# Patient Record
Sex: Female | Born: 1955 | Hispanic: Yes | Smoking: Never smoker
Health system: Western US, Academic
[De-identification: ages and names within clinical notes are randomized; demographics above are authoritative.]

## PROBLEM LIST (undated history)

## (undated) DIAGNOSIS — M1990 Unspecified osteoarthritis, unspecified site: Secondary | ICD-10-CM

## (undated) DIAGNOSIS — M459 Ankylosing spondylitis of unspecified sites in spine: Secondary | ICD-10-CM

## (undated) DIAGNOSIS — I10 Essential (primary) hypertension: Secondary | ICD-10-CM

## (undated) DIAGNOSIS — E669 Obesity, unspecified: Secondary | ICD-10-CM

## (undated) DIAGNOSIS — M545 Low back pain: Secondary | ICD-10-CM

## (undated) DIAGNOSIS — E785 Hyperlipidemia, unspecified: Secondary | ICD-10-CM

## (undated) DIAGNOSIS — E039 Hypothyroidism, unspecified: Secondary | ICD-10-CM

## (undated) DIAGNOSIS — E119 Type 2 diabetes mellitus without complications: Secondary | ICD-10-CM

## (undated) DIAGNOSIS — E1169 Type 2 diabetes mellitus with other specified complication: Secondary | ICD-10-CM

## (undated) DIAGNOSIS — K219 Gastro-esophageal reflux disease without esophagitis: Secondary | ICD-10-CM

## (undated) HISTORY — PX: KNEE SURGERY: SHX244

## (undated) HISTORY — PX: ABDOMINAL HYSTERECTOMY: SHX81

## (undated) HISTORY — DX: Obesity, unspecified: E66.9

## (undated) HISTORY — DX: Hyperlipidemia, unspecified: E78.5

## (undated) HISTORY — DX: Type 2 diabetes mellitus without complications (CMS-HCC): E11.9

## (undated) HISTORY — DX: Hyperlipidemia, unspecified: E11.69

## (undated) HISTORY — DX: Essential (primary) hypertension: I10

## (undated) HISTORY — DX: Gastro-esophageal reflux disease without esophagitis: K21.9

## (undated) HISTORY — DX: Hypothyroidism, unspecified: E03.9

## (undated) HISTORY — DX: Low back pain: M54.5

---

## 2005-01-28 HISTORY — PX: RIGHT OOPHORECTOMY: SHX2359

## 2014-05-11 ENCOUNTER — Encounter: Payer: Self-pay | Admitting: Family Medicine

## 2014-05-11 ENCOUNTER — Ambulatory Visit: Payer: Self-pay | Admitting: Family Practice

## 2014-05-11 VITALS — BP 162/80 | HR 88 | Temp 97.0°F | Resp 16 | Wt 191.5 lb

## 2014-05-11 DIAGNOSIS — Z0000 Encounter for general adult medical examination without abnormal findings: Secondary | ICD-10-CM

## 2014-05-11 DIAGNOSIS — E1169 Type 2 diabetes mellitus with other specified complication: Secondary | ICD-10-CM

## 2014-05-11 DIAGNOSIS — F419 Anxiety disorder, unspecified: Secondary | ICD-10-CM

## 2014-05-11 DIAGNOSIS — E039 Hypothyroidism, unspecified: Secondary | ICD-10-CM

## 2014-05-11 DIAGNOSIS — I10 Essential (primary) hypertension: Secondary | ICD-10-CM

## 2014-05-11 DIAGNOSIS — E119 Type 2 diabetes mellitus without complications: Principal | ICD-10-CM

## 2014-05-11 DIAGNOSIS — E785 Hyperlipidemia, unspecified: Secondary | ICD-10-CM

## 2014-05-11 MED ORDER — ATORVASTATIN CALCIUM 40 MG OR TABS
40.0000 mg | ORAL_TABLET | Freq: Every day | ORAL | Status: DC
Start: 2014-05-11 — End: 2014-08-03

## 2014-05-11 MED ORDER — LEVOTHYROXINE SODIUM 112 MCG OR TABS
112.0000 ug | ORAL_TABLET | Freq: Every day | ORAL | Status: DC
Start: 2014-05-11 — End: 2014-06-08

## 2014-05-11 MED ORDER — METFORMIN HCL 500 MG OR TABS
500.0000 mg | ORAL_TABLET | Freq: Two times a day (BID) | ORAL | Status: DC
Start: 2014-05-11 — End: 2014-08-03

## 2014-05-11 MED ORDER — METOPROLOL SUCCINATE 100 MG OR TB24
100.0000 mg | ORAL_TABLET | Freq: Every day | ORAL | Status: DC
Start: 2014-05-11 — End: 2014-08-03

## 2014-05-11 MED ORDER — ASPIRIN EC 81 MG OR TBEC (CUSTOM)
81.0000 mg | DELAYED_RELEASE_TABLET | Freq: Every day | ORAL | Status: DC
Start: 2014-05-11 — End: 2014-08-03

## 2014-05-11 MED ORDER — LISINOPRIL 40 MG OR TABS
40.0000 mg | ORAL_TABLET | Freq: Every day | ORAL | Status: DC
Start: 2014-05-11 — End: 2014-08-03

## 2014-05-11 MED ORDER — HYDROCHLOROTHIAZIDE 25 MG OR TABS
25.0000 mg | ORAL_TABLET | Freq: Every day | ORAL | Status: DC
Start: 2014-05-11 — End: 2014-08-03

## 2014-05-11 NOTE — Progress Notes (Signed)
[place] [facility] Progress Note    Student(s): Tressie Stalker    Attending: Dr. Katrinka Blazing      SUBJECTIVE:  Felicia [name] is a 59 year old female with history of type 2 diabetes mellitus, hyperlipidemia, and hypothyroidism presenting to establish care.     She received her care previously at [facility] but could no longer afford to pay for visits there.    Anxiety: Pt feels she excessively worries. She states she does not feel depressed but is tearful during the interview. She feels nervous today coming to a new health care facility. Has had insomnia for the last month, mainly trouble falling asleep. Regarding her sleep hygiene, she does not drink caffeine but does watch TV before bed and has a lot of thoughts that keep her up (such as worrying about her sons). She denies recent stressors but lives with her husband who she caught cheating 10 years ago and has almost no communication with and poor support from. Has not had formal therapy but is interested in it.     Diabetes: Pt does not check sugars since she is well-controlled (last A1c at [facility] was 6.2 on 03/02/14) and does not own a glucometer. Denies polyuria, polydipsia, vision changes, and numbness/tingling. She states she was seen by a nutrition clinic in the past and has had success with a low-carb diet, having lost 5 pounds.     Hypertension: Pt does not check her blood pressures at home. She typically has had okay readings at prior visits at [facility] (SBP at 03/29/14 visit was 120). She notes she feels more nervous today since this is a new health care facility for her. Currently on lisinopril 40 mg daily, HCTZ 25 mg daily, and metoprolol 100 mg daily, which she takes regularly but ran out of the metoprolol 2 days ago. Endorses a pressure-like headache 2 days ago but resolved on its own. Denies vision changes.    Hyperlipidemia: Currently on lovastatin 20 mg daily and omega3 1000 mg BID. Last lipid panel at [facility] - cholesterol 149,  HDL 44, LDL 81, triglycerides 120 (date unclear).    Hypothyroidism: Pt notes that she does not have symptoms with her hypothyroidism. Denies fatigue, constipation, cold intolerance, brittle hair.     Health care maintenance: Negative pap and mammo in Feb 2016. Did not get a flu vaccine this year. Has never had colon cancer screening.      Social History: Lives with husband and 2 adult children. Has another child who lives in [place]. Never smoker, never drinker, no drug use. Not currently sexually active.    Current Outpatient Prescriptions   Medication Sig    aspirin 81 MG EC tablet Take 1 tablet (81 mg) by mouth daily.    atorvastatin (LIPITOR) 40 MG tablet Take 1 tablet (40 mg) by mouth daily.    hydrochlorothiazide (HYDRODIURIL) 25 MG tablet Take 1 tablet (25 mg) by mouth daily.    levothyroxine (SYNTHROID) 112 MCG tablet Take 1 tablet (112 mcg) by mouth every morning (before breakfast).    lisinopril (PRINIVIL, ZESTRIL) 40 MG tablet Take 1 tablet (40 mg) by mouth daily.    metFORMIN (GLUCOPHAGE) 500 MG tablet Take 1 tablet (500 mg) by mouth 2 times daily (with meals).    metoprolol succinate (TOPROL XL) 100 MG XL tablet Take 1 tablet (100 mg) by mouth daily.     No current facility-administered medications for this visit.       Review  of Systems: See above. Denies chest pain, shortness of breath, bowel/bladder changes.      OBJECTIVE:  BP 162/80 mmHg   Pulse 88   Temp(Src) 97 F (36.1 C)   Resp 16   Wt 86.864 kg (191 lb 8 oz)  Gen: middle-aged, overweight Hispanic female, tearful at times during interview  Lungs: clear to auscultation bilaterally  CV: regular rate and rhythm  Ext: DP, PT pulses +2 and sensation to light touch intact bilaterally    Labs: 03/02/14 Hgb A1c 6.2  ?date lipid panel - cholesterol 149, HDL 44, LDL 81, triglycerides 120    PHQ 2: 2      Assessment and Plan:  Felicia [name] was seen today to establish care.    Diagnoses and all orders for this visit:    Type 2 diabetes mellitus without  complication (HCC)  -     metFORMIN (GLUCOPHAGE) 500 MG tablet; Take 1 tablet (500 mg) by mouth 2 times daily (with meals).  -     aspirin 81 MG EC tablet; Take 1 tablet (81 mg) by mouth daily.  -     Labs: Hgb A1c, microalbumin:Cr, BMP (check Cr since pt on metformin)    Hyperlipidemia associated with type 2 diabetes mellitus - lovastatin not available, will switch to atorvastatin (high intensity since pt diabetic and ASCVD risk >10%)  -     atorvastatin (LIPITOR) 40 MG tablet; Take 1 tablet (40 mg) by mouth daily.    Hypothyroidism, unspecified type  -     levothyroxine (SYNTHROID) 75 MCG tablet; Take 1.5 tablets (112 mcg) by mouth every morning (before breakfast).  -     Labs: TSH    Anxiety  - Therapy: Pt changed her mind and does not want to talk to Psychology today. Psychology will check-in with pt at next follow-up    Essential hypertension - Hypertensive today with SBP in 160's, though by history controlled. May be attributed to pt's increased anxiety today and being off metoprolol for a couple days. We will keep pt on previous meds and recheck BP in a month.  -     lisinopril (PRINIVIL, ZESTRIL) 40 MG tablet; Take 1 tablet (40 mg) by mouth daily.  -     hydrochlorothiazide (HYDRODIURIL) 25 MG tablet; Take 1 tablet (25 mg) by mouth daily.  -     metoprolol succinate (TOPROL XL) 100 MG XL tablet; Take 1 tablet (100 mg) by mouth daily.    Health care maintenance  - Colon cancer screening with FOBT    Patient to RTC on: 06/08/14    To do items at next visit:  BP check, lab results (Cr [pt on metformin], TSH, A1c, microalbumin:Cr), levothyroxine refill, FOBT cards, Psychology to check-in with pt    Patient seen, discussed and examined with Dr. Arvil ChacoSmith    Felicia [name], MS4

## 2014-05-13 NOTE — Progress Notes (Signed)
I saw and discussed the patient w/ Tressie StalkerSusan Onami MS4 and agree w/ documentation.  Dr. Rebeca AlertSunny Shimika Ames  [place] license [id number]

## 2014-05-18 ENCOUNTER — Encounter: Payer: Self-pay | Admitting: Family Medicine

## 2014-05-18 LAB — BASIC METABOLIC PANEL, BLOOD
BUN: 15
Bicarbonate: 20
Calcium: 9.5
Chloride: 103
Creatinine: 0.52
Glucose: 91
Potassium: 3.8
Sodium: 138
eGFR non-Afr.American: 104

## 2014-05-18 LAB — RANDOM URINE MICROALBUMIN
Creatinine, Random Urine: 51
Microalbumin, Urine: 0.2

## 2014-05-18 LAB — GLYCOSYLATED HGB(A1C), BLOOD: Glyco Hgb (A1C): 6.3 — ABNORMAL HIGH

## 2014-05-18 NOTE — Progress Notes (Signed)
Labs reviewed and entered in EPIC  Significant for well controlled DM with A1C = 6.3 and normal Micro/Cr    Pt to RTC as directed for f/u    N. Sherlon Handingodriguez, M.D.  [id number]

## 2014-05-18 NOTE — Progress Notes (Signed)
Lab results from 05/11/14. Entered by Theda SersVictoria Chang, MS1.    Results remarkable for elevated HbA1C in the diabetic range. Patient currently taking Metformin. May consider adding medication to lower HbA1C. Glucose at the time of blood draw within normal range.

## 2014-06-08 ENCOUNTER — Ambulatory Visit: Payer: Self-pay | Admitting: Family Practice

## 2014-06-08 VITALS — BP 138/86

## 2014-06-08 DIAGNOSIS — E119 Type 2 diabetes mellitus without complications: Secondary | ICD-10-CM

## 2014-06-08 DIAGNOSIS — E039 Hypothyroidism, unspecified: Principal | ICD-10-CM

## 2014-06-08 DIAGNOSIS — I10 Essential (primary) hypertension: Secondary | ICD-10-CM

## 2014-06-08 MED ORDER — LEVOTHYROXINE SODIUM 112 MCG OR TABS
112.0000 ug | ORAL_TABLET | Freq: Every day | ORAL | Status: DC
Start: 2014-06-08 — End: 2014-08-03

## 2014-06-08 NOTE — Progress Notes (Signed)
[place] [facility] Progress Note    Student(s): Harvest DarkAriana Dagdag, [place]    Attending: Dr. Rebeca AlertSunny [name]    SUBJECTIVE:  Felicia [name] is a 59 year old female with history of type 2 diabetes mellitus, hyperlipidemia, anxiety and hypothyroidism presenting for f/u to review lab results and new ear complaint    #Ear complaint  -Feels she has sensation of "something moving" in her ear. States that in her culture this is a sign of "air in the brain"  -Denies any ear pain. Was nervous about putting cotton swab in her ear but has been using her finger a lot to rub the inside of her ear.    #DM2. A1c 6.3 (05/11/14)  -Pt doesn't wish to check FBGs at home because she is "in denial" about her DM2 Dx  -Denies polyuria, polydipsia, polyphagia, blurry vision, foot numbness    #Anxiety  -Anxiety symptoms improved since last visit. Takes walks to help when her stress and anxiety get bad.  -Not interested in seeing psychologist at this time    #Hypertension:   -Currently on lisinopril 40 mg daily, HCTZ 25 mg daily, and metoprolol 100 mg daily  -Denies HA, dizziness, CP    #Hyperlipidemia  -Taking Atorvastatin 40mg     #Hypothyroidism:  -Taking Levothyroxine 112 mcg daily  -Endorses dry skin but denies fatigue, constipation, cold intolerance, brittle hair.     Current Outpatient Prescriptions   Medication Sig    aspirin 81 MG EC tablet Take 1 tablet (81 mg) by mouth daily.    atorvastatin (LIPITOR) 40 MG tablet Take 1 tablet (40 mg) by mouth daily.    hydrochlorothiazide (HYDRODIURIL) 25 MG tablet Take 1 tablet (25 mg) by mouth daily.    levothyroxine (SYNTHROID) 112 MCG tablet Take 1 tablet (112 mcg) by mouth every morning (before breakfast).    lisinopril (PRINIVIL, ZESTRIL) 40 MG tablet Take 1 tablet (40 mg) by mouth daily.    metFORMIN (GLUCOPHAGE) 500 MG tablet Take 1 tablet (500 mg) by mouth 2 times daily (with meals).    metoprolol succinate (TOPROL XL) 100 MG XL tablet Take 1 tablet (100 mg) by mouth daily.          No current facility-administered medications for this visit.     Review of Systems: negative besides what is mentioned in HPI    OBJECTIVE:  BP 138/86 mmHg  Gen: Pleasant, overweight Hispanic female, no acute distress  HEENT: Very slight erythema of the external auditory canal R>L with evidence of small abrasion/irritation in the superficial anterori part of the R ear canal  Lungs: clear to auscultation bilaterally  CV: regular rate and rhythm  Ext: DP, PT pulses +1 and sensation to light touch intact bilaterally  Neuro: normal monofilament exam    Labs:     Lab Results   Component Value Date    A1C 6.3 05/11/2014     Lab Results   Component Value Date    CREAT 0.52 05/11/2014     Microalbumin/Cr ratio: 4    ?date lipid panel - cholesterol 149, HDL 44, LDL 81, triglycerides 120    PHQ 2: 2    Assessment and Plan:    Felicia HornMaria Moreno Laske is a 59 year old female with history of type 2 diabetes mellitus, hyperlipidemia, anxiety and hypothyroidism presenting for f/u to review lab results and new ear complaint    Diagnoses and all orders for this visit:    Ear problem. Small area of redness in the  right ear, appears to be irritation or scratch, no other obvious abnormalities seen  -Instructed pt to avoid putting her finger in her ear as there is evidence of abrasion or scratch from nail  -Advised pt to let warm water run into ear in the shower or to use bulb syringe from drug store for irrigation but to avoid putting anything in the ear    Type 2 diabetes mellitus without complication. Very well controlled with HbA1c 6.3. Pt not checking sugars at home, OK given good control with HbA1c. No evidence of kidney dysfunction with normal Cr and microalbumin/Cr ratio.  -     metFORMIN (GLUCOPHAGE) 500 MG tablet; Take 1 tablet (500 mg) by mouth 2 times daily (with meals).  -     aspirin 81 MG EC tablet; Take 1 tablet (81 mg) by mouth daily.    Hyperlipidemia associated with type 2 diabetes mellitus -  -     atorvastatin  (LIPITOR) 40 MG tablet; Take 1 tablet (40 mg) by mouth daily.    Hypothyroidism, unspecified type. Adequate dosing levothyroxine given TSH 0.63 (05/11/14)  -     levothyroxine (SYNTHROID) 75 MCG tablet; Take 1.5 tablets (112 mcg) by mouth every morning (before breakfast), filled today    Anxiety  - Continue stress relief strategies (walking etc)  - Therapy could be an option in the future if patient changes her mind    Essential hypertension - BP well controlled on initial check at 138/86 but increased to 156/86 on recheck. Pt reports BP in normal range (can't remember number) when checking at drug store. Given BP at goal on first check today and reported normal range BPs at home will continue current medication regimen.   - Could consider increased dosing in the future despite fact that elevated BP appears to be 2/2 white coat hypertention given concern that BP is most likely frequently elevated in this patient with ongoing anxiety issues.  -     lisinopril (PRINIVIL, ZESTRIL) 40 MG tablet; Take 1 tablet (40 mg) by mouth daily.  -     hydrochlorothiazide (HYDRODIURIL) 25 MG tablet; Take 1 tablet (25 mg) by mouth daily.  -     metoprolol succinate (TOPROL XL) 100 MG XL tablet; Take 1 tablet (100 mg) by mouth daily.    Health care maintenance  -Negative pap and mammo in Feb 2016  -FOBT given to lab today    Patient to RTC on: August 03, 2014 for medication refills    To do at next visit:  -Refill all medications  -Reckeck BP, consider medication adjustments    Patient seen, discussed and examined with Dr. Lonzo CandySmith    Ariana Dagdag, MS4

## 2014-06-09 LAB — CREATININE W/GFR: Microalbumin Ratio: 4

## 2014-06-09 LAB — TSH, BLOOD: TSH: 0.63

## 2014-06-09 NOTE — Progress Notes (Signed)
I saw and discussed the patient w/ Ariana Dagdag MS4 and agree w/ documentation.  Dr. Madeliene Tejera  [place] license [id number]

## 2014-06-15 ENCOUNTER — Encounter: Payer: Self-pay | Admitting: Family Medicine

## 2014-06-15 NOTE — Progress Notes (Signed)
Patient received FOBT cards 06/08/14 and results from the FOBT were performed 06/15/14. No blood was detected in any of the three boxes (0/3 boxes positive for blood).     Please have patient complete FOBT in one more year.    Results Input by: Claiborne RiggStephanie Walker, MS1  Reviewed by: Dr. Rebeca AlertSunny Smith

## 2014-08-03 ENCOUNTER — Ambulatory Visit: Payer: Self-pay | Admitting: Family Practice

## 2014-08-03 ENCOUNTER — Encounter: Payer: Self-pay | Admitting: Family Practice

## 2014-08-03 VITALS — BP 142/70 | HR 72 | Temp 98.9°F | Resp 18 | Wt 193.0 lb

## 2014-08-03 DIAGNOSIS — E785 Hyperlipidemia, unspecified: Secondary | ICD-10-CM

## 2014-08-03 DIAGNOSIS — K2970 Gastritis, unspecified, without bleeding: Principal | ICD-10-CM

## 2014-08-03 DIAGNOSIS — E1169 Type 2 diabetes mellitus with other specified complication: Secondary | ICD-10-CM

## 2014-08-03 DIAGNOSIS — I10 Essential (primary) hypertension: Secondary | ICD-10-CM

## 2014-08-03 DIAGNOSIS — E119 Type 2 diabetes mellitus without complications: Secondary | ICD-10-CM

## 2014-08-03 DIAGNOSIS — E039 Hypothyroidism, unspecified: Secondary | ICD-10-CM

## 2014-08-03 MED ORDER — METFORMIN HCL 500 MG OR TABS
500.0000 mg | ORAL_TABLET | Freq: Two times a day (BID) | ORAL | 0 refills | Status: DC
Start: 2014-08-03 — End: 2014-11-02

## 2014-08-03 MED ORDER — LEVOTHYROXINE SODIUM 112 MCG OR TABS
112.0000 ug | ORAL_TABLET | Freq: Every day | ORAL | 0 refills | Status: DC
Start: 2014-08-03 — End: 2014-11-02

## 2014-08-03 MED ORDER — HYDROCHLOROTHIAZIDE 25 MG OR TABS
25.0000 mg | ORAL_TABLET | Freq: Every day | ORAL | 0 refills | Status: DC
Start: 2014-08-03 — End: 2014-11-02

## 2014-08-03 MED ORDER — ASPIRIN EC 81 MG OR TBEC (CUSTOM)
81.0000 mg | DELAYED_RELEASE_TABLET | Freq: Every day | ORAL | 0 refills | Status: DC
Start: 2014-08-03 — End: 2014-11-02

## 2014-08-03 MED ORDER — LISINOPRIL 40 MG OR TABS
40.0000 mg | ORAL_TABLET | Freq: Every day | ORAL | 0 refills | Status: DC
Start: 2014-08-03 — End: 2014-11-02

## 2014-08-03 MED ORDER — ATORVASTATIN CALCIUM 40 MG OR TABS
40.0000 mg | ORAL_TABLET | Freq: Every day | ORAL | 0 refills | Status: DC
Start: 2014-08-03 — End: 2014-11-02

## 2014-08-03 MED ORDER — RANITIDINE HCL 150 MG OR TABS
150.0000 mg | ORAL_TABLET | Freq: Two times a day (BID) | ORAL | 0 refills | Status: DC
Start: 2014-08-03 — End: 2014-11-02

## 2014-08-03 MED ORDER — METOPROLOL SUCCINATE 100 MG OR TB24
100.0000 mg | ORAL_TABLET | Freq: Every day | ORAL | 0 refills | Status: DC
Start: 2014-08-03 — End: 2014-11-02

## 2014-08-03 NOTE — Progress Notes (Signed)
[place] [facility] Progress Note  Students: Lauraine Rinne, MS4 and Wallis Bamberg, PS1  Attending: Dr. Rebeca Alert    SUBJECTIVE:  Felicia [name] is a 59 year old female with history of type 2 diabetes mellitus, hyperlipidemia, anxiety and hypothyroidism presenting for f/u on HTN and med refills.    # Bloating and epigastric burning  For the past 3 days she has bloating after meals, no changes in diet.  Does have some burning sensation in epigastric region, worse with spicy foods.  No belching or sour taste in mouth.  Eating smaller, more frequent meals to avoid this sensation. Had been diagnosed with "gastritis" 4 years ago, refused medication so she just changed her diet to avoid spicy foods.  Doesn't recall ever having stool testing for H. pylori.   Has omeprazole from a prescription about a year ago, worked well for a period of time and did not use antibiotics, but doesn't use now.  Feels like she has the same symptoms now.    Thinks this problem might be from her medicines, although the last one was changed three months ago.  Not taking any NSAIDs, no alcohol use, no caffeine.  No nausea or vomiting.  Had an episode of GI upset with nausea, vomiting, and diarrhea a month ago (bug going around at housing complex) but currently not experiencing other GI symptoms.  No diarrhea or constipation.  No abdominal pain.     # Right knee pain  Other complaint is of right knee pain for 4 days, thinks its related to the weather.  Never had knee pain before, no problems with right knee.  Feels like there's a nail in her knee.  Exacerbated by standing after sitting for a period of time.  Walking helps the pain.  No other joint pains.  No fevers or chills.  No muscle aches or pain with walking.    Complains of pain at dorsum of feet and inner aspect of ankles when she steps on cold ground.  No pain at plantar surfaces, no numbness or tingling.    Denies new life stressors or other changes.    Exercise: 1 hour of  walking, 5-6 days/week.    # DM2, A1c 6.3 (05/11/14)  - Pt still doesn't check FBGs at home because she says a doctor from [facility] told her a year ago that she doesn't need to check.  - Drinks liquado with half green apple and 4 strawberries.  Avoids starches, juices, sodas.  - Denies polyuria, polydipsia, polyphagia, blurry vision, foot numbness    # Hypertension   -Currently on lisinopril 40 mg daily, HCTZ 25 mg daily, and metoprolol 100 mg daily.  Takes all three in the morning, reports full compliance.  Doesn't check BPs on her own.  -Denies HA, dizziness, CP, DOE    #Hyperlipidemia  -Taking Atorvastatin 40mg     #Hypothyroidism:  -Taking Levothyroxine 112 mcg daily  -Endorses dry skin but denies fatigue, constipation, cold intolerance, brittle hair.     # Anxiety  - Anxiety symptoms stable since last visit. Takes walks to help when her stress and anxiety get bad.  -Not interested in seeing psychologist at this time    # Ear itching  Still has itching and foreign body sensation in her right ear.  Has not been compliant with suggestion to avoid using finger to pick at ear or trial lavage.  Denies ear pain.  Not particularly bothered by symptoms, says she doesn't need further evaluation or  treatment.    Current Outpatient Prescriptions   Medication Sig    aspirin 81 MG EC tablet Take 1 tablet (81 mg) by mouth daily.    atorvastatin (LIPITOR) 40 MG tablet Take 1 tablet (40 mg) by mouth daily.    hydrochlorothiazide (HYDRODIURIL) 25 MG tablet Take 1 tablet (25 mg) by mouth daily.    levothyroxine (SYNTHROID) 112 MCG tablet Take 1 tablet (112 mcg) by mouth every morning (before breakfast).    lisinopril (PRINIVIL, ZESTRIL) 40 MG tablet Take 1 tablet (40 mg) by mouth daily.    metFORMIN (GLUCOPHAGE) 500 MG tablet Take 1 tablet (500 mg) by mouth 2 times daily (with meals).    metoprolol succinate (TOPROL XL) 100 MG XL tablet Take 1 tablet (100 mg) by mouth daily.     No current facility-administered medications  for this visit.    Has omeprazole from a prescription about a year ago, but doesn't use now.      Review of Systems: negative except as noted above in HPI    OBJECTIVE  BP 142/70  Pulse 72  Temp 98.9 F (37.2 C) (Oral)  Resp 18  Wt 87.5 kg (193 lb)     Gen: Pleasant, overweight Hispanic female, no acute distress  HEENT: Oropharynx clear, several dental fillings  Lungs: clear to auscultation bilaterally  CV: regular rate and rhythm  Abd: soft, non-distended, NABS in all quadrants, mild TTP in epigastrium, no rebound or guarding.  Ext: DP, PT pulses +1 and sensation to light touch intact bilaterally  Neuro: normal monofilament exam (performed by pharmacy student)    Labs:   Lab Results   Component Value Date    A1C 6.3 05/11/2014     Lab Results   Component Value Date    CREAT 0.52 05/11/2014     Microalbumin/Cr ratio: 4    ?date lipid panel - cholesterol 149, HDL 44, LDL 81, triglycerides 120    PHQ 2: 0    Assessment and Plan  Felicia [name] is a 59 year old female with DM 2, HTN, HLD, hypothyroidism, and anxiety presenting for f/u on BPs and med refill.      Bloating with epigastric tenderness and previous alleviation with omeprazole suggests GERD.  Offered H. pylori stool test but patient agrees to empiric trial of alternative antacid agent.  - Begin ranitidine  BID    DM 2: Very well controlled with HbA1c 6.3. Pt not checking sugars at home, OK given good control with HbA1c. No evidence of kidney dysfunction with normal Cr and microalbumin/Cr ratio.  Demonstrates good understanding of dietary control.   ASCVD 10-year risk 9.5%, 2.3% with optimized risk factors.  On appropriate atorva 40 qday  Recommend scheduling with ophtho clinic next visit for dilated fundus exam for retinopathy  -     Metformin  BID  -     Aspirin 81 mg daily    Essential hypertension - BP readings have been high in past but seem to be related to white coat hypertension.  Given borderline reading today of 142/70, would pursue  more reliable readings from in between visits before increasing medications.  Encouraged her to continue exercise and diet measures.  - Assigned BP checks at drugstores as she is able and bring 2-3 measurements with her at next clinic visit.  -     lisinopril (PRINIVIL, ZESTRIL) 40 MG tablet; Take 1 tablet (40 mg) by mouth daily.  -     hydrochlorothiazide (HYDRODIURIL) 25 MG tablet;  Take 1 tablet (25 mg) by mouth daily.  -     metoprolol succinate (TOPROL XL) 100 MG XL tablet; Take 1 tablet (100 mg) by mouth daily.    Hyperlipidemia associated with type 2 diabetes mellitus  -     atorvastatin (LIPITOR) 40 MG tablet; Take 1 tablet (40 mg) by mouth daily.    Hypothyroidism, unspecified type. Adequate dosing levothyroxine given TSH 0.63 (05/11/14) and no changes in symptoms.  -     levothyroxine (SYNTHROID) 75 MCG tablet; Take 1.5 tablets (112 mcg) by mouth every morning (before breakfast)    Ear itching: She is not particularly bothered by this and is not requesting further evaluation or treatment.    Anxiety  - Continue stress relief strategies (walking etc)  - Therapy could be an option in the future if patient changes her mind    Health care maintenance  - Pneumovax given today  - Negative pap and mammo in Feb 2016  - FOBT negative 05/2014    Patient to RTC in 3 months (11/02/2014) for medication refills and follow-up.    To do at next visit:  - Refill medications  - Reckeck BP and correlate with readings from outside, consider medication adjustments  - F/u on GERD symptoms with addition of ranitidine  - Discuss diabetic eye exam as she has never had one, consider scheduling for initial screening in ophtho clinic    Patient seen, discussed, and examined with Dr. Katrinka BlazingSmith.  Lauraine RinneJeff Tsao, MS4

## 2014-08-08 NOTE — Progress Notes (Signed)
I saw and discussed the patient w/the medical student and agree w/ documentation.  Dr. Rebeca AlertSunny [place]  [place] license [id number]

## 2014-11-02 ENCOUNTER — Ambulatory Visit: Payer: Self-pay | Admitting: Hospitalist

## 2014-11-02 ENCOUNTER — Encounter: Payer: Self-pay | Admitting: Family Practice

## 2014-11-02 DIAGNOSIS — K2970 Gastritis, unspecified, without bleeding: Secondary | ICD-10-CM

## 2014-11-02 DIAGNOSIS — E119 Type 2 diabetes mellitus without complications: Secondary | ICD-10-CM

## 2014-11-02 DIAGNOSIS — E039 Hypothyroidism, unspecified: Secondary | ICD-10-CM

## 2014-11-02 DIAGNOSIS — E785 Hyperlipidemia, unspecified: Principal | ICD-10-CM

## 2014-11-02 DIAGNOSIS — E1169 Type 2 diabetes mellitus with other specified complication: Principal | ICD-10-CM

## 2014-11-02 DIAGNOSIS — I10 Essential (primary) hypertension: Secondary | ICD-10-CM

## 2014-11-02 MED ORDER — RANITIDINE HCL 150 MG OR TABS
150.0000 mg | ORAL_TABLET | Freq: Two times a day (BID) | ORAL | 0 refills | Status: DC
Start: 2014-11-02 — End: 2015-04-26

## 2014-11-02 MED ORDER — HYDROCHLOROTHIAZIDE 25 MG OR TABS
25.0000 mg | ORAL_TABLET | Freq: Every day | ORAL | 0 refills | Status: DC
Start: 2014-11-02 — End: 2015-02-01

## 2014-11-02 MED ORDER — ASPIRIN EC 81 MG OR TBEC (CUSTOM)
81.0000 mg | DELAYED_RELEASE_TABLET | Freq: Every day | ORAL | 0 refills | Status: DC
Start: 2014-11-02 — End: 2015-02-01

## 2014-11-02 MED ORDER — LEVOTHYROXINE SODIUM 112 MCG OR TABS
112.0000 ug | ORAL_TABLET | Freq: Every day | ORAL | 0 refills | Status: DC
Start: 2014-11-02 — End: 2015-02-01

## 2014-11-02 MED ORDER — LISINOPRIL 40 MG OR TABS
40.0000 mg | ORAL_TABLET | Freq: Every day | ORAL | 0 refills | Status: DC
Start: 2014-11-02 — End: 2015-02-01

## 2014-11-02 MED ORDER — ATORVASTATIN CALCIUM 40 MG OR TABS
40.0000 mg | ORAL_TABLET | Freq: Every day | ORAL | 0 refills | Status: DC
Start: 2014-11-02 — End: 2015-02-01

## 2014-11-02 MED ORDER — METOPROLOL SUCCINATE 100 MG OR TB24
100.0000 mg | ORAL_TABLET | Freq: Every day | ORAL | 0 refills | Status: DC
Start: 2014-11-02 — End: 2015-02-01

## 2014-11-02 MED ORDER — METFORMIN HCL 500 MG OR TABS
500.0000 mg | ORAL_TABLET | Freq: Two times a day (BID) | ORAL | 0 refills | Status: DC
Start: 2014-11-02 — End: 2015-02-01

## 2014-11-02 NOTE — Progress Notes (Signed)
[facility] Progress Note    Student(s): Gregary Signs MS4, Garth Schlatter MS1    Attending: Dr. Roseanne Reno    SUBJECTIVE:  Felicia [name] is a 59 year old female with a history of GERD, DM2, HTN, HLD, Hypothyroidism, and anxiety who presents today with back pain and for medicine refills.     #Back Pain:  Patient presents with back pain that began on the bus today. The pain was described as pressure and spasm-like, located mid-way down the back medially as well as laterally approximately 2 inches out. Pain lasted for a few seconds before being resolved by walking, which she reports made it feel warm. Pain was a 10/10 when it occurred. Non-radiating. Never had similar pain before this episode.     #DM2:  Ms. Hanger denies symptoms consistent with diabetes. Does not check her blood sugar; was told by someone at [facility] that she does not need to. Currently taking Metformin 500mg  BID, which she generally takes regularly though occasionally only takes once/day. Also taking aspirin 81mg . She denies polydipsia, polyuria, polyphagia. Denies numbness in the feet/hands and blurred vision.  Did not see opthomology yet, referral placed at last visit.     #Hypothyroidism:  Patient with history of hypothyroidism controlled on levothyroxine qDay. Last TSH 0.63 (05/11/2014). Today, patient reports dry skin on hands, for which she uses lotion which brings her resolve. Occasionally itchy, though not regularly. Otherwise, denies symptoms of fatigue, dry hair/nails, cold intolerance, and constipation. In fact, patient states she regularly feels hot rather than cold.    #HTN:   Patient with history of HTN controlled with lisinopriol 40mg  qDay, HCTZ 25mg  qday, metoprolol 100mg  qday. Reports that BP increases when she sees healthcare providers ("white coat" HTN). However, does not have a baseline outside of hospital due to lack of BP cuff in house, and does not visit [facility]. She denies dizziness, HAs, SOB,  CP, and DOE.     #GERD:  Provided ranitidine 150mg  at last visit due to feelings of epigastric tenderness and reflux symptoms. Has been taking BID with resolve of symptoms. Denies abdominal pain, vomiting, diarrhea, and constipation.     #HLD:  Patient currently taking atorvastatin 40mg , no reported symptoms.     #Anxiety:  Patient denies feeling anxious today. Reports walking with friends to control anxiety. Has not seen a psychologist in the past.     Current Outpatient Prescriptions   Medication Sig    aspirin 81 MG EC tablet Take 1 tablet (81 mg) by mouth daily.    atorvastatin (LIPITOR) 40 MG tablet Take 1 tablet (40 mg) by mouth daily.    hydrochlorothiazide (HYDRODIURIL) 25 MG tablet Take 1 tablet (25 mg) by mouth daily.    levothyroxine (SYNTHROID) 112 MCG tablet Take 1 tablet (112 mcg) by mouth every morning (before breakfast).    lisinopril (PRINIVIL, ZESTRIL) 40 MG tablet Take 1 tablet (40 mg) by mouth daily.    metFORMIN (GLUCOPHAGE) 500 MG tablet Take 1 tablet (500 mg) by mouth 2 times daily (with meals).    metoprolol succinate (TOPROL XL) 100 MG XL tablet Take 1 tablet (100 mg) by mouth daily.    ranitidine (ZANTAC) 150 MG tablet Take 1 tablet (150 mg) by mouth 2 times daily.     No current facility-administered medications for this visit.        Review of Systems: As in HPI    OBJECTIVE:   11/02/14  1900 11/02/14  2050   BP:  142/78   Pulse: 76    Resp: 18    Temp: 98 F (36.7 C)      Gen: WDWN, NAD adult female  Lungs: CTAB, no w/r/r, no increased work of breathing   CV: RRR, normal S1/S2, no m/r/g  Back: No abnormal curvature. Patient with TTP over the medial aspect of the back, around T8-T12, with point tenderness at the lateral aspects approximately 2-3 cm from the midline.     Labs:  Lab Results   Component Value Date    A1C 6.3 05/11/2014     Lab Results   Component Value Date    NA 138 05/11/2014    K 3.8 05/11/2014    CL 103 05/11/2014    BICARB 20 05/11/2014    BUN 15 05/11/2014     CREAT 0.52 05/11/2014    GLU 91 05/11/2014    [place] 9.5 05/11/2014     Lab Results   Component Value Date    TSH 0.63 05/11/2014     Lab Results   Component Value Date    CREAT 0.52 05/11/2014     Microalbumin/Cr ratio: 4    ?date lipid panel - cholesterol 149, HDL 44, LDL 81, triglycerides 120    Assessment and Plan:    #Back Pain: Isolated episode of sharp non-radiating paravertebral back pain, reproducible on exam. Based on symptomatology and exam findings, most likely diagnosis is  Of muscular origin, likely muscle spasm/tightness. Low concern for neuronal etiology due to isolated incident and non-radiating, non-numbness findings.  - Recommended stretching exercises, specifically of hamstrings 1-2 minutes/day  - Follow up at next visit for resolution     #DM2: Well controlled. Last HbA1c 6.3 (05/11/2014).   - Continue Metformin  BID and aspirin 81 mg qDay  - Encouraged continued walking and diet low in carbohydrates  - Follow up at next visit for opthomology appointment, consider re-referral if not done  - HbA1c ordered today    #Hypothyroidism: Controlled, with potential signs of over-correction in the setting of feeling excessively warm and with last TSH 0.63 (05/11/2014).   - TSH ordered today  - Plan to continue on 112 mcg levothyroxine, can consider decreasing dose if considered over-corrected    #HTN: BP readings have been elevated in the past, but have been attributed to white coat HTN. Borderline reading again today of 142/78. Unclear if truly due to white coat HTN because don't have baseline readings outside of clinic. Currently asymptomatic.   - Encouraged patient to get [facility] at [facility] or other local pharmacy before next visit  - Continue lisinopril  qDay, HCTZ  qDay, and metoprolol succinate  XL qDay    #GERD: Controlled on ranitidine  BID.   - Continue ranitidine  BID    #HLD: Controlled on atorvastatin  qDay  -Continue atorvastatin   - Plan for lipid panel  04/2015    #Anxiety: Patient reports anxiety is controlled, though has heightened concern of isolated incident of back pain experienced today which raises concern for continued anxiety. Patient does not believe she requires any intervention at this time.   - Follow up at next visit regarding anxiety symptoms    Patient to RTC in: 3 months on 02/01/2015    To do items at next visit:  - Review lab results, make adjustments to medications as necessary  - Check for optho appointment completion  - Ask about baseline BP measurements at outside facilities   - Medication refills    Patient  seen, discussed and examined with Dr. Roseanne Reno.    Gregary Signs, MS4  Garth Schlatter, MS1

## 2014-11-09 ENCOUNTER — Encounter: Payer: Self-pay | Admitting: Family Medicine

## 2014-11-09 LAB — GLYCOSYLATED HGB(A1C), BLOOD: Glyco Hgb (A1C): 7.3 — ABNORMAL HIGH

## 2014-11-09 LAB — TSH, BLOOD: TSH: 4

## 2014-11-09 NOTE — Progress Notes (Addendum)
Labs reviewed and entered in EPIC  Significant for A1C of 7.3    Pt to RTC as directed for f/u    N. Sherlon Handingodriguez, M.D.  [id number]

## 2014-11-09 NOTE — Progress Notes (Deleted)
Encounter created in error

## 2014-11-29 DIAGNOSIS — M545 Low back pain, unspecified: Secondary | ICD-10-CM

## 2014-11-29 HISTORY — DX: Low back pain, unspecified: M54.50

## 2015-02-01 ENCOUNTER — Ambulatory Visit: Payer: Self-pay | Admitting: Family Practice

## 2015-02-01 DIAGNOSIS — M25551 Pain in right hip: Secondary | ICD-10-CM

## 2015-02-01 DIAGNOSIS — I10 Essential (primary) hypertension: Secondary | ICD-10-CM

## 2015-02-01 DIAGNOSIS — M25552 Pain in left hip: Principal | ICD-10-CM

## 2015-02-01 DIAGNOSIS — E1169 Type 2 diabetes mellitus with other specified complication: Secondary | ICD-10-CM

## 2015-02-01 DIAGNOSIS — E785 Hyperlipidemia, unspecified: Secondary | ICD-10-CM

## 2015-02-01 DIAGNOSIS — E119 Type 2 diabetes mellitus without complications: Secondary | ICD-10-CM

## 2015-02-01 DIAGNOSIS — E039 Hypothyroidism, unspecified: Secondary | ICD-10-CM

## 2015-02-01 MED ORDER — METFORMIN HCL 500 MG OR TABS
500.0000 mg | ORAL_TABLET | Freq: Two times a day (BID) | ORAL | 0 refills | Status: DC
Start: 2015-02-01 — End: 2015-04-26

## 2015-02-01 MED ORDER — LEVOTHYROXINE SODIUM 112 MCG OR TABS
112.0000 ug | ORAL_TABLET | Freq: Every day | ORAL | 0 refills | Status: DC
Start: 2015-02-01 — End: 2015-04-26

## 2015-02-01 MED ORDER — ATORVASTATIN CALCIUM 40 MG OR TABS
40.0000 mg | ORAL_TABLET | Freq: Every day | ORAL | 0 refills | Status: DC
Start: 2015-02-01 — End: 2015-04-26

## 2015-02-01 MED ORDER — LISINOPRIL 40 MG OR TABS
40.0000 mg | ORAL_TABLET | Freq: Every day | ORAL | 0 refills | Status: DC
Start: 2015-02-01 — End: 2015-04-26

## 2015-02-01 MED ORDER — ASPIRIN EC 81 MG OR TBEC (CUSTOM)
81.0000 mg | DELAYED_RELEASE_TABLET | Freq: Every day | ORAL | 0 refills | Status: DC
Start: 2015-02-01 — End: 2015-04-26

## 2015-02-01 MED ORDER — METOPROLOL SUCCINATE 100 MG OR TB24
100.0000 mg | ORAL_TABLET | Freq: Every day | ORAL | 0 refills | Status: DC
Start: 2015-02-01 — End: 2015-04-26

## 2015-02-01 MED ORDER — HYDROCHLOROTHIAZIDE 25 MG OR TABS
25.0000 mg | ORAL_TABLET | Freq: Every day | ORAL | 0 refills | Status: DC
Start: 2015-02-01 — End: 2015-04-26

## 2015-02-01 MED ORDER — IBUPROFEN 200 MG OR TABS
400.0000 mg | ORAL_TABLET | Freq: Three times a day (TID) | ORAL | 0 refills | Status: DC | PRN
Start: 2015-02-01 — End: 2015-04-26

## 2015-02-01 NOTE — Progress Notes (Signed)
[facility] Progress Note    Student(s): Felicia Phoenix MS1  Resident: Felicia Pollack PGY3    Attending: Dr. Katrinka Blazing    SUBJECTIVE:  Felicia [name] is a 60 yo, Spanish-speaking female with past medical history of DM2, HTN, HLD, Hypertension, GERD, anxiety, and back pain who presents to clinic today for her regularly scheduled 3 month med refill and f/u who has a concern about recent hip/pelvic/back pain.     #CC: hip bone pain around her hips and pelvis     #Hip/Pelvic Pain:  Pt reports pelvic/hip pain in her "bones" consistently daily with periodic worsening to a 9/10 on a pain scale. The pain began on Thanksgiving day she believes after sitting on seat warmers then going out into the cold. She reports taking a "pain medicine" from the internet that relieves the pain but was not sure the name of the med. She reports this med made the pain decrease to a 3/10 on the pain scale. She reports the pain worsening upon walking sometimes, but sometimes walking alleviating the pain. Pt denied weakness or numbness or tingling. Pt reported non-radiating pain and denied muscle or nerve pain. Of note, patient also endorsed intermittent midline low back pain. Patient denied red flags including fever/chills, history of cancer, trauma, bowel/bladder dysfunction, lower extremity numbness/tingling/weakness.  Denies dyspareunia, vaginal discharge, gyn concerns.     #DM2: Pt reported being told not to take her blood sugar because she is only on Metformin, no insulin. Pt reported no diet changes, low sugar intake. Pt denied polydypsia and polyuria, although she mentioned she does drink a lot of water to prevent decreased urination. Pt reported taking her Metformin regularly as prescribed. Pt reported having had a diabetic foot exam and eye exam in the last year with no reported complications.    #Hypothyroidism: Pt reported taking her levothyroxine regularly as prescribed with no changes in weight, no constipation, and no mood  changes.     #GERD:  Pt reported no symptoms and that her GERD is under control with her occasional use of ranitidine as needed.     Objective:   Pts vitals were normal, BP 130/60, HR 78, RR 18, O2 98%, and 98 degree oral temperature.     General: Well-nourished and well-developed, obese woman who seemed healthy and pleasant.   Cardiac: Normal rate and rhythm with no murmurs, rubs or gallops.    Pulm: Normal bilateral lung sounds to auscultation upon respiration with no crackles, wheeze.   Back: No cervical/thoracic spinal tenderness, mild lumbar spinal tenderness; bilateral lumbar paraspinal muscle tenderness; negative straight leg raise bilaterally  Extremities: full ROM with bilateral knee flexion, normal ROM with bilateral hip external rotation, +groin pain at inner bilateral ischium with rotation; 5/5 strength in bilateral lower extremities with hip flexion/extension; normal gait    Labs:  Lab Results   Component Value Date    NA 138 05/11/2014    K 3.8 05/11/2014    CL 103 05/11/2014    BICARB 20 05/11/2014    BUN 15 05/11/2014    CREAT 0.52 05/11/2014    GLU 91 05/11/2014    [place] 9.5 05/11/2014     Lab Results   Component Value Date    A1C 7.3 11/02/2014     Lab Results   Component Value Date    TSH 4.00 11/02/2014         Assessment/Plan:   Kaydie is a 60 yo, Spanish-speaking female with past medical history of DM2, HTN, HLD,  Hypertension, GERD, anxiety, and back pain who presents to clinic today for her hip/pelvic/back pain and her normal 3 month checkup.    #Hip/Pelvic Pain: pain likely secondary to mild to moderate inflammation given reassuring physical exam findings and negative red flags with minor to no back pain.   - start ibuprofen 400mg  q8h prn for mild to moderate pain  - can trial heating pad and/or ice for pain  - reassess at follow up appointment  - if worsening pain despite conservative management, will consider further workup eg imaging    #DM2: last A1c 7.3%  - f/u repeat A1c today  - continue  metformin 500mg  BID. May increase to 1000 BID if not <7%  - check for changes at 3 month visits    #Hypothyroidism: asymptomatic  - f/u TSH today  - continue levothyroxine 112mcg qday  - check on maintenance of symptoms at regular checkups    #GERD: symptoms well-controlled with ranitidine prn  - continue ranitidine prn  - check on maintenance of symptoms at regular checkups    #HTN: BP at goal today  - f/u BMP  - continue lisinopril, metoprolol, and hydrochlorothiazide as prescribed.   - continue to monitor BP    Patient discussed with attending physician, Dr. Katrinka BlazingSmith.    Felicia PhoenixLauren [name], MS1  Felicia [name], PGY3

## 2015-02-02 NOTE — Progress Notes (Signed)
I saw and discussed the patient w/ and agree w/ documentation.  Dr. Rebeca AlertSunny Smith  [place] license [id number]

## 2015-02-08 ENCOUNTER — Encounter: Payer: Self-pay | Admitting: Family Medicine

## 2015-02-08 LAB — BASIC METABOLIC PANEL, BLOOD
BUN: 25
Bicarbonate: 26
Calcium: 9.7
Chloride: 102
Creatinine: 0.8
Glucose: 109 — ABNORMAL HIGH
Potassium: 4.2
Sodium: 137
eGFR African American: 92
eGFR non-Afr.American: 80

## 2015-02-08 LAB — GLYCOSYLATED HGB(A1C), BLOOD: Glyco Hgb (A1C): 6.9 — ABNORMAL HIGH

## 2015-02-08 LAB — TSH, BLOOD: TSH: 4.48

## 2015-02-08 NOTE — Progress Notes (Signed)
Patient had blood drawn for a BMP and HbA1c; patient was not fasting for these tests.    Results were significant for:  Elevated glucose 109  Elevated HbA1c 6.9%    Results Input by: Claiborne RiggStephanie Walker, MS2  Results Reviewed by: Dr. Katrinka BlazingSmith

## 2015-04-26 ENCOUNTER — Encounter: Payer: Self-pay | Admitting: Family Medicine

## 2015-04-26 ENCOUNTER — Ambulatory Visit: Payer: Self-pay | Admitting: Internal Medicine

## 2015-04-26 DIAGNOSIS — E119 Type 2 diabetes mellitus without complications: Principal | ICD-10-CM

## 2015-04-26 DIAGNOSIS — E785 Hyperlipidemia, unspecified: Secondary | ICD-10-CM

## 2015-04-26 DIAGNOSIS — M25551 Pain in right hip: Secondary | ICD-10-CM

## 2015-04-26 DIAGNOSIS — K2970 Gastritis, unspecified, without bleeding: Secondary | ICD-10-CM

## 2015-04-26 DIAGNOSIS — E039 Hypothyroidism, unspecified: Secondary | ICD-10-CM

## 2015-04-26 DIAGNOSIS — I10 Essential (primary) hypertension: Secondary | ICD-10-CM

## 2015-04-26 DIAGNOSIS — E1169 Type 2 diabetes mellitus with other specified complication: Secondary | ICD-10-CM

## 2015-04-26 DIAGNOSIS — M25552 Pain in left hip: Secondary | ICD-10-CM

## 2015-04-26 MED ORDER — IBUPROFEN 200 MG OR TABS
400.0000 mg | ORAL_TABLET | Freq: Three times a day (TID) | ORAL | 0 refills | Status: DC | PRN
Start: 2015-04-26 — End: 2015-07-19

## 2015-04-26 MED ORDER — RANITIDINE HCL 150 MG OR TABS
150.0000 mg | ORAL_TABLET | Freq: Two times a day (BID) | ORAL | 0 refills | Status: DC
Start: 2015-04-26 — End: 2015-07-19

## 2015-04-26 MED ORDER — METFORMIN HCL 500 MG OR TABS
500.0000 mg | ORAL_TABLET | Freq: Two times a day (BID) | ORAL | 0 refills | Status: DC
Start: 2015-04-26 — End: 2015-07-19

## 2015-04-26 MED ORDER — METOPROLOL SUCCINATE 100 MG OR TB24
100.0000 mg | ORAL_TABLET | Freq: Every day | ORAL | 0 refills | Status: DC
Start: 2015-04-26 — End: 2015-07-19

## 2015-04-26 MED ORDER — ATORVASTATIN CALCIUM 40 MG OR TABS
40.0000 mg | ORAL_TABLET | Freq: Every day | ORAL | 0 refills | Status: DC
Start: 2015-04-26 — End: 2015-07-19

## 2015-04-26 MED ORDER — LEVOTHYROXINE SODIUM 112 MCG OR TABS
112.0000 ug | ORAL_TABLET | Freq: Every day | ORAL | 0 refills | Status: DC
Start: 2015-04-26 — End: 2015-07-19

## 2015-04-26 MED ORDER — HYDROCHLOROTHIAZIDE 25 MG OR TABS
25.0000 mg | ORAL_TABLET | Freq: Every day | ORAL | 0 refills | Status: DC
Start: 2015-04-26 — End: 2015-07-19

## 2015-04-26 MED ORDER — ASPIRIN EC 81 MG OR TBEC (CUSTOM)
81.0000 mg | DELAYED_RELEASE_TABLET | Freq: Every day | ORAL | 0 refills | Status: DC
Start: 2015-04-26 — End: 2015-07-19

## 2015-04-26 MED ORDER — LISINOPRIL 40 MG OR TABS
40.0000 mg | ORAL_TABLET | Freq: Every day | ORAL | 0 refills | Status: DC
Start: 2015-04-26 — End: 2015-07-19

## 2015-04-26 NOTE — Progress Notes (Signed)
Medical Student Progress Note  Attending: Dr. Renard Matteradman, MD    Felicia HornMaria Moreno Vandall   MRN: [id number]   DOB: Sep 13, 1955   Date of care: 04/26/2015      CC: Refills    Subjective:   Felicia [name] is a 60 year old female with past medical history of DM II at goal, HTN, HLD, GERD, back pain, hypothyroidism (controlled) here today for medication refill.    Patient feels well and is here for med refill.  Her last A1c was at goal (6.9), we do not have a lipid panel on her.  She has no complaints about GERD since starting ranitidine.  Her hypothyroidism is under control with last TSH wnl.  She previously had anxiety, but her stressor has gone away and she has been feeling fine and just taking tea.    Her back pain has been getting worse.  It first started 5 months ago and she started experiencing bilateral shooting pains 2 months ago that go down to her knees posteriorly  She was previously getting physical therapy which included stretches, and this helped a lot.  However, she has not been able to do them because she couldn't afford it, and she thinks this has contributed to worsening pain.  At one point she was taking flenex [place] obtained from [place], but she is not taking it anymore.  She considers it a mild issue although bothersome. 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     Seen with a Nurse, learning disabilitytranslator.       Allergies: Review of patient's allergies indicates no known allergies.  Current Outpatient Prescriptions on File Prior to Visit   Medication Sig Dispense Refill    [DISCONTINUED] aspirin 81 MG EC tablet Take 1 tablet (81 mg) by mouth daily. 90 tablet 0    [DISCONTINUED] atorvastatin (LIPITOR) 40 MG tablet Take 1 tablet (40 mg) by mouth daily. 90 tablet 0    [DISCONTINUED] hydrochlorothiazide (HYDRODIURIL) 25 MG tablet Take 1 tablet (25 mg) by mouth daily. 90 tablet 0    [DISCONTINUED] ibuprofen (MOTRIN) 200 MG tablet Take 2 tablets (400 mg) by mouth every 8 hours as needed for Mild Pain (Pain Score 1-3) or  Moderate Pain (Pain Score 4-6). Take with food. 60 tablet 0    [DISCONTINUED] levothyroxine (SYNTHROID) 112 MCG tablet Take 1 tablet (112 mcg) by mouth every morning (before breakfast). 90 tablet 0    [DISCONTINUED] lisinopril (PRINIVIL, ZESTRIL) 40 MG tablet Take 1 tablet (40 mg) by mouth daily. 90 tablet 0    [DISCONTINUED] metFORMIN (GLUCOPHAGE) 500 MG tablet Take 1 tablet (500 mg) by mouth 2 times daily (with meals). 180 tablet 0    [DISCONTINUED] metoprolol succinate (TOPROL XL) 100 MG XL tablet Take 1 tablet (100 mg) by mouth daily. 90 tablet 0    [DISCONTINUED] ranitidine (ZANTAC) 150 MG tablet Take 1 tablet (150 mg) by mouth 2 times daily. 180 tablet 0     No current facility-administered medications on file prior to visit.            Objective:   Vitals:    04/26/15 1834   BP: 150/62   Pulse: 70   Resp: 16   Temp: 97.8 F (36.6 C)   SpO2: 98%   Weight: 86.6 kg (191 lb)       [place] PHQ9 DEPRESSION QUESTIONNAIRE 05/11/2014 05/11/2014 08/03/2014 02/01/2015   Interest 2 2 0 0   Depressed 0 0 0 0   Sleep -- -- -- --  Energy -- -- -- --   Appetite -- -- -- --   Failure -- -- -- --   Concentration -- -- -- --   Movement -- -- -- --   Suicide -- -- -- --   Summary(Manual) -- -- -- --   Summary(Calculated) -- -- -- --   Functional -- -- -- --       Physical Exam   Gen - well appearing obese female appearing younger than her stated age  HEENT - pupils are equally reactive, EOMI  Cardio - no murmurs,rubs.  Regular rate and rhythm  Lungs - clear to ausculatation bilaterally  Back - straight leg raise on left negative, on right positive at 80 degrees (shooting pain in hamstrings).  Pt was asked to bend over - she was able to touch her toes but this did not elicit shooting pain. No spinous process TTP, TTP PSIS B and hamstring TTP to squeeze. Able to stand on toes and heels, nl squat/stand  Extremities - pedal sensation intact     Labs   Jan A1c: 6.9  Jan TSH: 4.48  Jan Cre: 0.8  No LFTs on file  No lipids on  file    Assessment/Plan:     Felicia [name] is a 60 year old female with past medical history of DM II at goal, HTN, HLD, GERD, back pain, hypothyroidism (controlled) here today for medication refill.    #Hip/Pelvic Pain: pain likely secondary to hamstring tightness and injury.  Also sciatica is possible though less likely since patient is able to bend over completely without eliciting pain.  - continue ibuprofen  q8h prn for mild to moderate pain  - can trial heating pad and/or ice for pain  - reassess at follow up appointment  - if worsening pain despite conservative management, will consider further workup eg imaging    #DM2: last A1c 6.9%  - f/u repeat A1c at next visit (patient will have labs drawn at [facility] 2 weeks prior to next full visit)  - continue metformin  BID.   - check for changes at 6 month intervals since patient is meeting diabetes goals   -LFTs need to be ordered for baseline since pt is taking metformin  - (patient will have labs drawn at [facility] 2 weeks prior to next full visit)    #Hypothyroidism: asymptomatic, TSH wnl  - continue levothyroxine qday    #GERD: symptoms well-controlled with ranitidine prn. Patient with no complaints.  - continue ranitidine prn    #HTN: BP is elevated today with systolic 150, though this was done after [employer]. Last readings were 142 and 142.  Patient does not check BP at home.  - Encouraged patient to check BP at home  - continue lisinopril, metoprolol, and hydrochlorothiazide as prescribed.   - if BP is elevated at next visit, HTN medications should be increased    #HLD: No data on file.  -lipid panel (patient will have labs drawn at [facility] 2 weeks prior to next full visit) (fasting)    RTC in 3 months    Sharin Grave, MS4   Pt seen and evaluated with Dr. Renard Matter  04/26/2015 6:20 PM

## 2015-04-27 NOTE — Progress Notes (Signed)
[name] was seen and examined with MS Felicia [name], I agree with history, exam and assessment/plan as outlined.   Felicia [name] A. Renard Matteradman, MD

## 2015-07-05 ENCOUNTER — Other Ambulatory Visit: Payer: Self-pay | Admitting: Family Medicine

## 2015-07-05 ENCOUNTER — Encounter: Payer: Self-pay | Admitting: Family Practice

## 2015-07-07 LAB — BASIC METABOLIC PANEL, BLOOD
BUN: 20 mg/dL (ref 7–25)
Calcium: 9.4 mg/dL (ref 8.6–10.4)
Carbon Dioxide: 23 mmol/L (ref 20–31)
Chloride: 103 mmol/L (ref 98–110)
Creatinine: 0.59 mg/dL (ref 0.50–0.99)
Glucose: 76 mg/dL (ref 65–99)
Potassium: 4.4 mmol/L (ref 3.5–5.3)
Sodium: 138 mmol/L (ref 135–146)
eGFR African American: 115 mL/min/{1.73_m2} (ref >=60)
eGFR non-Afr.American: 99 mL/min/{1.73_m2} (ref >=60)

## 2015-07-07 LAB — NON HDL CHOLESTEROL -QUEST: Non-HDL Cholesterol: 77 mg/dL (calc)

## 2015-07-07 LAB — CREATININE, RANDOM URINE -QUEST: Creatinine, Random Urine: 149 mg/dL (ref 20–320)

## 2015-07-07 LAB — CHOLESTEROL, TOTAL -QUEST: Cholesterol: 123 mg/dL — ABNORMAL LOW (ref 125–200)

## 2015-07-07 LAB — HDL-CHOLESTEROL, BLOOD: HDL Cholesterol: 46 mg/dL (ref >=46)

## 2015-07-07 LAB — LDL CHOLESTEROL, DIRECT: LDL-Cholesterol: 56 mg/dL (calc) (ref <130)

## 2015-07-07 LAB — TRIGLYCERIDES, BLOOD: Triglycerides: 106 mg/dL (ref <150)

## 2015-07-07 LAB — GLYCOSYLATED HGB(A1C), BLOOD: Hgb A1C: 6.1 % of total Hgb — ABNORMAL HIGH (ref <5.7)

## 2015-07-07 LAB — RANDOM URINE MICROALB/CREAT RATIO PANEL
Microalbumin Random: 0.5 mg/dL
Microalbumin Ratio: 3 mcg/mg creat (ref <30)

## 2015-07-07 LAB — TSH W/REFLEX TO FT4-QUEST: TSH: 0.78 mIU/L (ref 0.40–4.50)

## 2015-07-07 LAB — CHOLESTEROL/HDLC RATIO-QUEST: Chol/HDLC Ratio: 2.7 (calc) (ref <=5.0)

## 2015-07-19 ENCOUNTER — Encounter: Payer: Self-pay | Admitting: Family Practice

## 2015-07-19 ENCOUNTER — Encounter (HOSPITAL_BASED_OUTPATIENT_CLINIC_OR_DEPARTMENT_OTHER): Payer: Self-pay | Admitting: Internal Medicine

## 2015-07-19 ENCOUNTER — Ambulatory Visit: Payer: Self-pay | Admitting: Internal Medicine

## 2015-07-19 DIAGNOSIS — E1169 Type 2 diabetes mellitus with other specified complication: Secondary | ICD-10-CM

## 2015-07-19 DIAGNOSIS — M545 Low back pain, unspecified: Secondary | ICD-10-CM

## 2015-07-19 DIAGNOSIS — E785 Hyperlipidemia, unspecified: Secondary | ICD-10-CM

## 2015-07-19 DIAGNOSIS — E039 Hypothyroidism, unspecified: Secondary | ICD-10-CM

## 2015-07-19 DIAGNOSIS — K219 Gastro-esophageal reflux disease without esophagitis: Secondary | ICD-10-CM

## 2015-07-19 DIAGNOSIS — E119 Type 2 diabetes mellitus without complications: Principal | ICD-10-CM

## 2015-07-19 DIAGNOSIS — I10 Essential (primary) hypertension: Secondary | ICD-10-CM

## 2015-07-19 MED ORDER — METOPROLOL SUCCINATE 100 MG OR TB24
100.0000 mg | ORAL_TABLET | Freq: Every day | ORAL | 0 refills | Status: DC
Start: 2015-07-19 — End: 2015-10-18

## 2015-07-19 MED ORDER — HYDROCHLOROTHIAZIDE 25 MG OR TABS
25.0000 mg | ORAL_TABLET | Freq: Every day | ORAL | 0 refills | Status: DC
Start: 2015-07-19 — End: 2015-10-18

## 2015-07-19 MED ORDER — ATORVASTATIN CALCIUM 40 MG OR TABS
40.0000 mg | ORAL_TABLET | Freq: Every day | ORAL | 0 refills | Status: DC
Start: 2015-07-19 — End: 2015-10-18

## 2015-07-19 MED ORDER — LISINOPRIL 40 MG OR TABS
40.0000 mg | ORAL_TABLET | Freq: Every day | ORAL | 0 refills | Status: DC
Start: 2015-07-19 — End: 2015-10-18

## 2015-07-19 MED ORDER — RANITIDINE HCL 150 MG OR TABS
150.0000 mg | ORAL_TABLET | Freq: Two times a day (BID) | ORAL | 0 refills | Status: DC
Start: 2015-07-19 — End: 2015-10-18

## 2015-07-19 MED ORDER — METFORMIN HCL 500 MG OR TABS
500.0000 mg | ORAL_TABLET | Freq: Two times a day (BID) | ORAL | 0 refills | Status: DC
Start: 2015-07-19 — End: 2015-10-18

## 2015-07-19 MED ORDER — LEVOTHYROXINE SODIUM 100 MCG OR TABS
100.0000 ug | ORAL_TABLET | Freq: Every day | ORAL | 0 refills | Status: DC
Start: 2015-07-19 — End: 2015-08-02

## 2015-07-19 MED ORDER — ASPIRIN EC 81 MG OR TBEC (CUSTOM)
81.0000 mg | DELAYED_RELEASE_TABLET | Freq: Every day | ORAL | 0 refills | Status: DC
Start: 2015-07-19 — End: 2015-10-18

## 2015-07-19 MED ORDER — IBUPROFEN 200 MG OR TABS
400.0000 mg | ORAL_TABLET | Freq: Three times a day (TID) | ORAL | 0 refills | Status: DC | PRN
Start: 2015-07-19 — End: 2015-10-18

## 2015-07-19 MED ORDER — LEVOTHYROXINE SODIUM 100 MCG OR TABS
100.0000 ug | ORAL_TABLET | Freq: Every day | ORAL | 0 refills | Status: DC
Start: 2015-07-19 — End: 2015-07-19

## 2015-07-19 NOTE — Progress Notes (Signed)
Patient was seen and discussed with Charleston PootLorenzo GonzalezMS4. Patient presents today for medication refill and follow-up for chronic back pain that first developed about 8 months ago. Still reports persistent but tolerable discomfort rated 5/10. No identifiable precipitating factors or trauma. Pain is worse when bending at the waist and with prolonged periods of sitting but improves with heat application. No alarm symptoms. Taking ibuprofen 400 mg twice weekly as she prefers to avoid medications. Remains active and walks for an hour daily. Further inquiry reveals patient is taking metformin 500 mg once daily rather than twice daily dosing as previously instructed. Good diabetic control with last A1C 6.9% on 02/01/15. Exam is notable for a comfortable appearing female with a normal gait.  Plan as follows:  1. PT and acupuncture referral for conservative management of back pain, may need to consider plain films if no improvement seen with interventions; continue heat application  2. Continue metformin 500 mg daily for now, will recheck A1C to assess glycemic control on self-reported daily dosing  3. Continue current anti-hypertensive regimen. BP at goal and well-controlled  4. LDL-C at goal. Continue atorvastatin  5. TSH wnl on 02/01/15. Continue levothyroxine supplementation  6. [facility] referral for breast cancer screening   7. FOBT cards provided for CRC screening  8. Tdap if available from pharmacy today, otherwise at next visit  RTC 3 months for medication refill and lab follow-up    Rowe RobertSoo Jamani Eley, MD

## 2015-07-19 NOTE — Progress Notes (Signed)
Chief Complaint   Patient presents with    Back Pain    Refill Request       Subjective:   Felicia [name] is a(n) 60 year old female with past medical history of DM II at goal, HTN, HLD, GERD, back pain, and hypothyroidism here today for medication refill and lower back pain.    Back pain:   Bilateral shooting pain started 8 months ago. She does not recall any initial event or movement that started the pain. She characterizes the pain as a constant burning that results in shooting pain that alternates down the left and right leg (greater on the right) and quantified at 5-6/10. Pain is exacerbated when she is sitting down for prolonged time or bending forward. She is currently taking ibuprofen 400 mg 2 times a week and heat which provides moderate relief. Patient states walking improves the pain. She does not have any urinary changes or numbness in the saddle area.    T2DM: Patient is only taking half of her metformin 1000 mg. Patient states feeling well and was worried about the amount of pills she is taking. Patient endorses increase thirst since her last visit, but denies any urinary frequency. She denies increase hunger or fatigue. Patient states no visual changes. She recently saw her optomatrist 7 months ago, and was given new glasses. The glasses do not fit the width of her head and using her previous set. She denies a change in her lens prescription.      HLD: She is consistent with her medication and recently had a lipid panel. Patients diet consist of fruits and toast in the morning, rice and beans with egg squash for lunch, and a cup of milk for dinner. Patient reports not having access to grocery stores and is limited in buying fresh vegetables, and only consumes green veggies 1x a week. She is consistently active by walking an hour a day with her daughter and granddaughter with moderate heart rate increase.     GERD: Patient denies any GERD exacerbation since being placed on ranitidine and taking  it as indicated     Hypothyroidism: Patient has a history of complete thyroid ablation. She denies any change in weight, temperature intolerance, palpitations, or change in energy level.     HCM:   -Patient has had a negative mammogram 2 years ago at [facility]. Patient does monthly self breast exams and reports no lumps, bumps, or discharge.  - Patient denies having an FOBT in the past.  - Patient does not recall a previous TDAP and there is no record of TDAP.       ROS:  ROS: No TIAs or unusual headaches, no dysphagia.  No prolonged cough. No dyspnea or chest pain on exertion.  No abdominal pain, change in bowel habits, black or bloody stools.  No urinary tract symptoms. Musculoskeletal symptoms noted above.  Menopausal with no abnormal vaginal bleeding, discharge or unexpected pelvic pain. No new breast lumps, breast pain or nipple discharge.      Current Outpatient Prescriptions   Medication Sig Dispense Refill    aspirin 81 MG EC tablet Take 1 tablet (81 mg) by mouth daily. 90 tablet 0    atorvastatin (LIPITOR) 40 MG tablet Take 1 tablet (40 mg) by mouth daily. 90 tablet 0    hydrochlorothiazide (HYDRODIURIL) 25 MG tablet Take 1 tablet (25 mg) by mouth daily. 90 tablet 0    ibuprofen (MOTRIN) 200 MG tablet Take 2 tablets (400 mg)  by mouth every 8 hours as needed for Mild Pain (Pain Score 1-3) or Moderate Pain (Pain Score 4-6). Take with food. 60 tablet 0    levothyroxine (SYNTHROID) 100 MCG tablet Take 1 tablet (100 mcg) by mouth every morning (before breakfast). 90 tablet 0    lisinopril (PRINIVIL, ZESTRIL) 40 MG tablet Take 1 tablet (40 mg) by mouth daily. 90 tablet 0    metFORMIN (GLUCOPHAGE) 500 MG tablet Take 1 tablet (500 mg) by mouth 2 times daily (with meals). 180 tablet 0    metoprolol succinate (TOPROL XL) 100 MG XL tablet Take 1 tablet (100 mg) by mouth daily. 90 tablet 0    ranitidine (ZANTAC) 150 MG tablet Take 1 tablet (150 mg) by mouth 2 times daily. 180 tablet 0     No current  facility-administered medications for this visit.      No Known Allergies      Objective:    BP 128/60   Pulse 79   Temp 97.7 F (36.5 C) (Oral)   Resp 16  General Appearance: healthy, alert, no distress, pleasant affect, cooperative.  Eyes:  conjunctivae and corneas clear. PERRL, EOM's intact.  Neck:  Neck supple. No adenopathy, thyroid symmetric, normal size.  Heart:  normal rate and regular rhythm, no murmurs, clicks, or gallops.  Lungs: clear to auscultation and percussion, no chest deformities noted.  Abdomen: Abdomen soft, non-tender. No masses or organomegaly. Bowel sounds normal.  Extremities:  no cyanosis, clubbing, or edema and distal pulses normal.  Musculoskeletal: Mild paraspinal tenderness. Positive right straight leg  Neuro: Gait normal. Reflexes normal and symmetric. Sensation and strength normal. 2+ upper bilateral reflexes, 2+ lower bilateral reflexes.     Labs:  Lab Results   Component Value Date    NA 138 07/05/2015    NA 137 02/01/2015    K 4.4 07/05/2015    K 4.2 02/01/2015    CL 103 07/05/2015    CL 102 02/01/2015    BICARB 23 07/05/2015    BICARB 26 02/01/2015    BUN 20 07/05/2015    BUN 25 02/01/2015    CREAT 0.59 07/05/2015    CREAT 0.80 02/01/2015    GLU 76 07/05/2015    GLU 109 02/01/2015    [place] 9.4 07/05/2015    [place] 9.7 02/01/2015     Lab Results   Component Value Date    TSH 0.78 07/05/2015    TSH 4.48 02/01/2015     Lab Results   Component Value Date    CHOL 123 07/05/2015    HDL 46 07/05/2015    TRIG 161 07/05/2015     Imaging:    Assessment and Plan    #Backpain: Patient states feeling a burning shooting pain that originates from lower back to posterior calf. It is relieved with exercise and stretching. Has not seen PT in the past. Currently taking Ibuprofen with moderate relief. Physical exam elicits pain on straight leg lift. No urinary symptoms and saddle sensation intact.   - Refer to Physical therapy (AppointmentJuly 5, 2017)  - Continue with Ibuprofen 400 mg QD PRN  -  Consider  flexiril and lower back X-ray if PT does not help    #DM2: Patient last A1C was 6.9 and taken in 6 months ago. Patient endorses polydipsia. She is not taking metformin as indicated (taking 1/2 dose). Diet consist of fruits and carbs. Counsoled on diet. Advised need for follow medications as indicated.   - Check A1C  - Reduce  metformin to 500 mg QD    #Hypothyroidism: No symptoms of hyper- or hypo thyroidism. Currently taking levothyroxine 100 mcg QD.   - Continue Levothyroxine 100 mcg    #HTN: Current blood pressure is at goal. Patient is taken medications as indicated. No symptoms of hypertension.   - Continue HCTZ 25 mg QD  - Continue Metoprolol succinate 100 mg QD  - Continue Lisinopril 40 mg QD    #GERD: Has not had any episodes of acid reflex since starting ranitidine.  - Continue ranitidine 150 mg QD    #HLD: Labs are WNL   - Continue atorvastatin 40 mg QD    #HCM: [facility] which was unremarkable. Patient denies having an FOBT and TDAP in the past and not found in chart. Will give  - refer for mammogram  - Will give FOBT today  - TDAP not available today at clinic will revisit next visit.         To do's:  - Patient was only given 30 tablets of levothyroxine 100 mg. Will need 60 tablets of levothyroxine 100 mg on August 12, 2015.   - Follow up A1C and adjust medication accordingly  - Follow up Back pain and PT  - Follow up FOBT  - Follow up mammogram  - Discuss TDAP      Return to Clinic in two weeks for PT and 3 months for lab follow up.     Filbert BertholdLorenzo Gonzalez MS4    Cc: No primary care provider on file.

## 2015-08-02 ENCOUNTER — Other Ambulatory Visit: Payer: Self-pay

## 2015-08-02 ENCOUNTER — Encounter: Payer: Self-pay | Admitting: Family Medicine

## 2015-08-02 DIAGNOSIS — E039 Hypothyroidism, unspecified: Principal | ICD-10-CM

## 2015-08-02 MED ORDER — LEVOTHYROXINE SODIUM 100 MCG OR TABS
100.0000 ug | ORAL_TABLET | Freq: Every day | ORAL | 0 refills | Status: DC
Start: 2015-08-02 — End: 2015-10-18

## 2015-08-02 NOTE — Progress Notes (Signed)
Pt here for lab draw  A1C and TSH ordered today    Otherwise pt to RTC as directed for f/u    N. Sherlon Handingodriguez, M.D.

## 2015-08-02 NOTE — Progress Notes (Signed)
Pt here to receive more levothyroxine (not available at her last visit).    Pt to RTC as directed for f/u    N. Sherlon Handingodriguez, M.D.

## 2015-08-03 NOTE — Progress Notes (Signed)
08/02/15  Eval:  60 yo female referred for PT for L4-5 back pain. Onset 11/2014 and since has become worse with radicular symptoms down both legs to calf, R > L LE. Pt denies trauma or fall. Night is worse than mornings. Agg: flexion and crouched posture. Sitting. Prone. Ease: supine, walking,   Pt is receiving acupuncture for 1st visit as well. Pain level now 8/10. 4/10 bilateral gluts and legs to calf. Today more central back pain, P1  PMH: DM Type 2,  HTN, Hyperlipidemia, and Hypothyroidism  Prior Level of Function: house work, walks 1 hr/day.   O/E  Observation: antalgic, slow, apprehensive movement. Pt req'd education regarding Log rolling and proper body mechanics.  AROM:    Lsp Rot R end range P1  Ext P1  Flex finger tips to toes, return into ext P1  R SB P1> LSB  R popliteal crease lower than L  R LE in supine appears longer, R heel of shoe more worn also.  SLR- R+  Sensation: denies numbness tingling.  STMassage over paraspinals. Pt with extreme tenderness over R>L side low back/glut area. Muscle guarding over low back. Increased temp to touch over low back around waist area.  Pt positioned on her stomach with 2 pillows under her abdomen for Gr 1 PAs over T4-T12. Pt tender throughout. Pt very stiff and painful.  Education:  Psychiatristroper body mechanics with bed mobility (log rolling). Lifting w/ knees, not your back! Pt was able to get oob with pillow between legs and maintain proper posture;  Pt appears to have possible herniation vs nerve root involvement. Pt would benefit for skilled PT to follow.  HEP: pelvic tilts, Lsp rotation to tol only, single knee to chest. Prone over pillows- forearm press-ups 10x.  Plan: See in 3 weeks 08/23/15 @ 8PM     Evangeline DakinLee Anne H Robotta PT, DPT

## 2015-08-04 LAB — TSH W/REFLEX TO FT4-QUEST: TSH: 0.96 m[IU]/L (ref 0.40–4.50)

## 2015-08-04 LAB — GLYCOSYLATED HGB(A1C), BLOOD: Hgb A1C: 5.7 %{Hb} — ABNORMAL HIGH (ref <5.7)

## 2015-08-23 ENCOUNTER — Encounter: Payer: Self-pay | Admitting: Family Medicine

## 2015-09-19 NOTE — Progress Notes (Signed)
Pt seen by Orland MustardLee Anne Robotta for PT  See PT note for details    N. Sherlon Handingodriguez, M.D.

## 2015-10-18 ENCOUNTER — Encounter: Payer: Self-pay | Admitting: Family Medicine

## 2015-10-18 ENCOUNTER — Ambulatory Visit: Payer: Self-pay | Admitting: Infectious Disease

## 2015-10-18 DIAGNOSIS — E039 Hypothyroidism, unspecified: Principal | ICD-10-CM

## 2015-10-18 DIAGNOSIS — M545 Low back pain, unspecified: Secondary | ICD-10-CM

## 2015-10-18 DIAGNOSIS — K219 Gastro-esophageal reflux disease without esophagitis: Secondary | ICD-10-CM

## 2015-10-18 DIAGNOSIS — E119 Type 2 diabetes mellitus without complications: Secondary | ICD-10-CM

## 2015-10-18 DIAGNOSIS — I10 Essential (primary) hypertension: Secondary | ICD-10-CM

## 2015-10-18 DIAGNOSIS — E1169 Type 2 diabetes mellitus with other specified complication: Secondary | ICD-10-CM

## 2015-10-18 DIAGNOSIS — E785 Hyperlipidemia, unspecified: Secondary | ICD-10-CM

## 2015-10-18 MED ORDER — LISINOPRIL 40 MG OR TABS
40.0000 mg | ORAL_TABLET | Freq: Every day | ORAL | 0 refills | Status: DC
Start: 2015-10-18 — End: 2016-01-10

## 2015-10-18 MED ORDER — HYDROCHLOROTHIAZIDE 25 MG OR TABS
25.0000 mg | ORAL_TABLET | Freq: Every day | ORAL | 0 refills | Status: DC
Start: 2015-10-18 — End: 2016-01-10

## 2015-10-18 MED ORDER — RANITIDINE HCL 150 MG OR TABS
150.0000 mg | ORAL_TABLET | Freq: Two times a day (BID) | ORAL | 0 refills | Status: DC
Start: 2015-10-18 — End: 2016-01-10

## 2015-10-18 MED ORDER — ASPIRIN EC 81 MG OR TBEC (CUSTOM)
81.0000 mg | DELAYED_RELEASE_TABLET | Freq: Every day | ORAL | 0 refills | Status: DC
Start: 2015-10-18 — End: 2016-01-10

## 2015-10-18 MED ORDER — METFORMIN HCL 500 MG OR TABS
500.0000 mg | ORAL_TABLET | Freq: Two times a day (BID) | ORAL | 0 refills | Status: DC
Start: 2015-10-18 — End: 2016-01-10

## 2015-10-18 MED ORDER — METOPROLOL SUCCINATE 100 MG OR TB24
100.0000 mg | ORAL_TABLET | Freq: Every day | ORAL | 0 refills | Status: DC
Start: 2015-10-18 — End: 2016-01-10

## 2015-10-18 MED ORDER — IBUPROFEN 200 MG OR TABS
400.0000 mg | ORAL_TABLET | Freq: Three times a day (TID) | ORAL | 0 refills | Status: DC | PRN
Start: 2015-10-18 — End: 2016-04-10

## 2015-10-18 MED ORDER — ATORVASTATIN CALCIUM 40 MG OR TABS
40.0000 mg | ORAL_TABLET | Freq: Every day | ORAL | 0 refills | Status: DC
Start: 2015-10-18 — End: 2016-01-10

## 2015-10-18 MED ORDER — LEVOTHYROXINE SODIUM 100 MCG OR TABS
100.0000 ug | ORAL_TABLET | Freq: Every day | ORAL | 0 refills | Status: DC
Start: 2015-10-18 — End: 2016-01-10

## 2015-10-18 NOTE — Progress Notes (Signed)
[facility] Progress Note    Student(s): Berneda RoseJesse Avaya Mcjunkins    Attending: Dr.  Monte FantasiaPan       SUBJECTIVE:  Felicia [name] is a 60 year old female with h/of DMII, HTN, HLD, GERD, lower back pain, hypothyroidism here for medication refill and follow up of chronic issues.    # Low back pain: Pt states pain has dramatically improved since her last visit. Formerly was complaining of severe daily pain now improved to 10 minute episodes of R sided pain with radiation down right leg that occur about once per week. Patient saw PT once in July but felt too much pain afterwards and decided not to return. Instead she decided to try stretching and exercising from [facility] which she feels has been greatly helpful. She is no longer taking motrin. She reports walking for one hour everyday without symptoms.      T2DM: Patient continues to take only 500 mg metformin. She denies increased thirst, urination, hunger or fatigue. Patient denies recent visual changes or numbness/tingling of the extremities. Since March, she reports 10 lbs weight loss by improving her diet on the advice of a family friend who is a Engineer, civil (consulting)nurse. She has focused on reducing meat,  carbs, and calories. She is motivated to continue weight loss.     GERD: Patient denies any GERD symptoms. Taking ranitidine as indicated     Hypothyroidism: Patient has a history of complete thyroid ablation. She denies temperature intolerance, palpitations, or change in energy level.        Current Outpatient Prescriptions   Medication Sig   . aspirin 81 MG EC tablet Take 1 tablet (81 mg) by mouth daily.   Marland Kitchen. atorvastatin (LIPITOR) 40 MG tablet Take 1 tablet (40 mg) by mouth daily.   . hydrochlorothiazide (HYDRODIURIL) 25 MG tablet Take 1 tablet (25 mg) by mouth daily.   Marland Kitchen. ibuprofen (MOTRIN) 200 MG tablet Take 2 tablets (400 mg) by mouth every 8 hours as needed for Mild Pain (Pain Score 1-3) or Moderate Pain (Pain Score 4-6). Take with food.   Marland Kitchen. levothyroxine  (SYNTHROID) 100 MCG tablet Take 1 tablet (100 mcg) by mouth every morning (before breakfast).   Marland Kitchen. lisinopril (PRINIVIL, ZESTRIL) 40 MG tablet Take 1 tablet (40 mg) by mouth daily.   . metFORMIN (GLUCOPHAGE) 500 MG tablet Take 1 tablet (500 mg) by mouth 2 times daily (with meals).   . metoprolol succinate (TOPROL XL) 100 MG XL tablet Take 1 tablet (100 mg) by mouth daily.   . ranitidine (ZANTAC) 150 MG tablet Take 1 tablet (150 mg) by mouth 2 times daily.     No current facility-administered medications for this visit.        Review of Systems: Negative except as above.     OBJECTIVE:  BP 140/80  Pulse 82  Temp 98 F (36.7 C)  Resp 16  Wt 82.1 kg (181 lb)  SpO2 97%  Gen: WDWN overweight Hispanic women in NAD  Lungs: CTAB no w/r/r  CV: RRR no m/r/g  Abd: Soft, non-tender non-distended  Ext: No edema, Sensation intact to light touch and symmetrical b/l     Assessment and Plan:  Felicia [name] is a 60 year old female with past medical history of DM II at goal, HTN, HLD, GERD,  hypothyroidism (controlled) here today for medication refill.    #Hip/Pelvic Pain: Resolved with only occasional mild symptoms after stretching and strengthening exercises. No longer requiring medications. Pt motivated to continue  current home therapy regimen and exercise.    - Continue home self-directed PT regimen     #DM2: Asymptomatic. Last A1c 5.7% on 500 mg metformin. Pt reports making dietary changes and increasing exercise with ongoing weight loss. Low A1c draws into question wether patient is still diabetic. Intended to repeat HgbA1c today (if normal would recommend discontinuing metformin and seeing how patient tolerates) however pt left prior to lab draw. Recommend repeating A1c at follow up    - Repeat A1c next visit   - continue metformin 500mg  daily for now.     #Hypothyroidism: asymptomatic, TSH wnl  - continue levothyroxine qday    #GERD: symptoms well-controlled with ranitidine prn. Patient with no  complaints.  - continue ranitidine prn    #HTN: BP is bordeline today with systolic 140,. Last readings were 150, 142, and 142.  Patient does not check BP at home. Reporting good adherence (including today) with BP regimen. Pt reluctant to take any additional meds. Will revaluate BP at follow up.   - Continue lisinopril, metoprolol, and hydrochlorothiazide as prescribed.   - If BP is elevated at next visit, HTN medications should be increased    #HLD:   Continue atorvastatin     Health Care Maintenance:    Immunizations: UTD on pneumovax (2016)  Cannot recall when last Tdap  Cancer Screening:  Pt planning on setting up mammogram this week.  Pt sent home with FOBT, will return at next appointment or next week when returning to clinic for optometry.   Patient to RTC on: 3 months for medication refill,  5 months for follow up.     To do items at next visit:  - Evaluate FOBT  - Follow up HgbA1c and adjust metformin accordingly       Patient seen, discussed and examined with Dr. Belinda Fisher

## 2015-10-18 NOTE — Interdisciplinary (Signed)
I got to interpret for Felicia HesselbachMaria with MS Felicia CavesJessie. Spoke about diabetes and back pain to student. Issue about glasses came up. After Interpreting I asked her if the glasses she was currently wearing were new and she said they're VERY old. She got glasses from [facility] about a year ago (also last time she got an eye exam) and she said they were too small and prescription is too strong (hurts her eyes and makes her dizzy). Spoke to her about [facility] next Wednesday and she is able to come. I put her on the list.    Approved by [name], MSW

## 2015-10-18 NOTE — Progress Notes (Signed)
Attending Attestation  I have seen and examined the patient with MS4 Berneda RoseJesse Wackerbarth. I have reviewed the relevant laboratory and imaging data. I have reviewed the patient's medications. I agree with the history, physical examination, assessment and plan as discussed with MS4 Berneda RoseJesse Wackerbarth.    Patient is here today for follow-up of her various medical concerns,    #DM: metformin 500mg  daily per patient report, last A1c 5.7 in 07/2015, states that she has been eating healthy and lost ~10lb, otherwise asymptomatic, no vision or neuropathy issue  - Congratulated patient on her effort to make healthy dietary changes and weight reduction  - Will repeat A1c today  - If A1c remains will controlled, would consider holding metformin and continue with dietary and lifestyle modification.    #HTN: borderline controlled today, on lisinopril, HCTZ, metoprolol. Will continue current regimen for now and encourage exercise and weight reduction.    #HLD: at goal, will continue Lipitor for now. Could also consider holding if patient is able to control her diabetes and HTN with lifestyle modifications.    #GERD: asymptomatic, on ranitidine    #hypothyroid: on synthroid, TSH 0.96, otherwise asymptomatic    #Back pain: for the past 11 months, saw PT once a month ago and has been doing exercises from YouTube at home since. Symptoms have much improved, not using motrin any more    #Health maintenance:  - Give FOBT today  - Patient will schedule appointment for mammogram    Please see MS4 Scarlette CalicoJesse Wackerbarth's note for further details.      Belinda FisherSamuel Pan, MD  [facility] Attending

## 2015-10-25 ENCOUNTER — Encounter: Payer: Self-pay | Admitting: Family Practice

## 2015-10-25 ENCOUNTER — Other Ambulatory Visit: Payer: Self-pay | Admitting: Family Practice

## 2015-10-25 NOTE — Interdisciplinary (Signed)
Felicia HesselbachMaria came today for [facility]. She needs glasses and will be back in 2 weeks to pick them up.    Approved by [name], MSW

## 2015-10-25 NOTE — Progress Notes (Signed)
Results input by Delrae RendAnthony Yip, MS2, 10/25/15    Patient brought in her FOBT card today. Results were: Negative X3     Health Care Maintenance was updated to include these results.

## 2015-10-27 LAB — GLYCOSYLATED HGB(A1C), BLOOD: Hgb A1C: 5.6 % of total Hgb (ref <5.7)

## 2015-10-27 LAB — SPECIMEN ID NOTIFICATION MISSING SECOND ID -QUEST

## 2015-12-06 NOTE — Interdisciplinary (Signed)
[facility]. She will receive glasses in a few weeks, she will be called when they arrive.      Felicia [name], BSW

## 2016-01-10 ENCOUNTER — Encounter: Payer: Self-pay | Admitting: Family Medicine

## 2016-01-10 DIAGNOSIS — K219 Gastro-esophageal reflux disease without esophagitis: Secondary | ICD-10-CM

## 2016-01-10 DIAGNOSIS — E039 Hypothyroidism, unspecified: Secondary | ICD-10-CM

## 2016-01-10 DIAGNOSIS — E785 Hyperlipidemia, unspecified: Secondary | ICD-10-CM

## 2016-01-10 DIAGNOSIS — E1169 Type 2 diabetes mellitus with other specified complication: Secondary | ICD-10-CM

## 2016-01-10 DIAGNOSIS — I10 Essential (primary) hypertension: Secondary | ICD-10-CM

## 2016-01-10 DIAGNOSIS — E119 Type 2 diabetes mellitus without complications: Principal | ICD-10-CM

## 2016-01-10 MED ORDER — METOPROLOL SUCCINATE 100 MG OR TB24
100.0000 mg | ORAL_TABLET | Freq: Every day | ORAL | 0 refills | Status: DC
Start: 2016-01-10 — End: 2016-04-10

## 2016-01-10 MED ORDER — RANITIDINE HCL 150 MG OR TABS
150.0000 mg | ORAL_TABLET | Freq: Every day | ORAL | 0 refills | Status: DC | PRN
Start: 2016-01-10 — End: 2016-04-10

## 2016-01-10 MED ORDER — METFORMIN HCL 500 MG OR TABS
500.0000 mg | ORAL_TABLET | Freq: Every day | ORAL | 0 refills | Status: DC
Start: 2016-01-10 — End: 2016-04-10

## 2016-01-10 MED ORDER — HYDROCHLOROTHIAZIDE 25 MG OR TABS
25.0000 mg | ORAL_TABLET | Freq: Every day | ORAL | 0 refills | Status: DC
Start: 2016-01-10 — End: 2016-04-10

## 2016-01-10 MED ORDER — LISINOPRIL 40 MG OR TABS
40.0000 mg | ORAL_TABLET | Freq: Every day | ORAL | 0 refills | Status: DC
Start: 2016-01-10 — End: 2016-04-10

## 2016-01-10 MED ORDER — ATORVASTATIN CALCIUM 40 MG OR TABS
40.0000 mg | ORAL_TABLET | Freq: Every day | ORAL | 0 refills | Status: DC
Start: 2016-01-10 — End: 2016-04-10

## 2016-01-10 MED ORDER — LEVOTHYROXINE SODIUM 100 MCG OR TABS
100.0000 ug | ORAL_TABLET | Freq: Every day | ORAL | 0 refills | Status: DC
Start: 2016-01-10 — End: 2016-04-10

## 2016-01-10 MED ORDER — ASPIRIN EC 81 MG OR TBEC (CUSTOM)
81.0000 mg | DELAYED_RELEASE_TABLET | Freq: Every day | ORAL | 0 refills | Status: DC
Start: 2016-01-10 — End: 2016-04-10

## 2016-01-10 NOTE — Progress Notes (Signed)
Pt here for med only visit    Refilled all meds x 3 months.    Reviewed labs    Lab Results   Component Value Date    A1C 5.6 10/25/2015    A1C 6.9 02/01/2015     Given pt in pre DM range will decrease metformin to 500 mg daily and recheck A1C in 3 months    Lab Results   Component Value Date    TSH 0.96 08/02/2015    TSH 4.48 02/01/2015     Pt asymptomatic on current dose of levothyroxine.    Pt to RTC 3 months for f/u    N. Sherlon Handingodriguez, M.D.

## 2016-01-10 NOTE — Interdisciplinary (Signed)
Felicia HesselbachMaria came to pick up her [facility] ([facility] which we had for her.    Approved by: Tobi BastosEsmeralda Preval, BSW

## 2016-02-07 ENCOUNTER — Encounter: Payer: Self-pay | Admitting: Family Medicine

## 2016-04-03 ENCOUNTER — Encounter: Payer: Self-pay | Admitting: Family Medicine

## 2016-04-10 ENCOUNTER — Other Ambulatory Visit: Payer: Self-pay

## 2016-04-10 ENCOUNTER — Encounter: Payer: Self-pay | Admitting: Family Practice

## 2016-04-10 ENCOUNTER — Ambulatory Visit: Payer: Self-pay | Admitting: Family Medicine

## 2016-04-10 DIAGNOSIS — K219 Gastro-esophageal reflux disease without esophagitis: Secondary | ICD-10-CM

## 2016-04-10 DIAGNOSIS — M545 Low back pain, unspecified: Secondary | ICD-10-CM

## 2016-04-10 DIAGNOSIS — E785 Hyperlipidemia, unspecified: Secondary | ICD-10-CM

## 2016-04-10 DIAGNOSIS — E1169 Type 2 diabetes mellitus with other specified complication: Secondary | ICD-10-CM

## 2016-04-10 DIAGNOSIS — E119 Type 2 diabetes mellitus without complications: Principal | ICD-10-CM

## 2016-04-10 DIAGNOSIS — I10 Essential (primary) hypertension: Secondary | ICD-10-CM

## 2016-04-10 DIAGNOSIS — E039 Hypothyroidism, unspecified: Secondary | ICD-10-CM

## 2016-04-10 MED ORDER — METOPROLOL SUCCINATE 100 MG OR TB24
100.0000 mg | ORAL_TABLET | Freq: Every day | ORAL | 0 refills | Status: DC
Start: 2016-04-10 — End: 2016-07-03

## 2016-04-10 MED ORDER — RANITIDINE HCL 150 MG OR TABS
150.0000 mg | ORAL_TABLET | Freq: Every day | ORAL | 0 refills | Status: DC | PRN
Start: 2016-04-10 — End: 2016-09-25

## 2016-04-10 MED ORDER — LEVOTHYROXINE SODIUM 100 MCG OR TABS
100.0000 ug | ORAL_TABLET | Freq: Every day | ORAL | 0 refills | Status: DC
Start: 2016-04-10 — End: 2016-07-03

## 2016-04-10 MED ORDER — METFORMIN HCL 500 MG OR TABS
500.0000 mg | ORAL_TABLET | Freq: Every day | ORAL | 0 refills | Status: DC
Start: 2016-04-10 — End: 2016-07-03

## 2016-04-10 MED ORDER — ASPIRIN EC 81 MG OR TBEC (CUSTOM)
81.0000 mg | DELAYED_RELEASE_TABLET | Freq: Every day | ORAL | 0 refills | Status: DC
Start: 2016-04-10 — End: 2016-07-03

## 2016-04-10 MED ORDER — LISINOPRIL 40 MG OR TABS
40.0000 mg | ORAL_TABLET | Freq: Every day | ORAL | 0 refills | Status: DC
Start: 2016-04-10 — End: 2016-07-03

## 2016-04-10 MED ORDER — ATORVASTATIN CALCIUM 40 MG OR TABS
40.0000 mg | ORAL_TABLET | Freq: Every day | ORAL | 0 refills | Status: DC
Start: 2016-04-10 — End: 2016-07-03

## 2016-04-10 MED ORDER — IBUPROFEN 200 MG OR TABS
400.0000 mg | ORAL_TABLET | Freq: Three times a day (TID) | ORAL | 0 refills | Status: DC | PRN
Start: 2016-04-10 — End: 2016-12-25

## 2016-04-10 MED ORDER — HYDROCHLOROTHIAZIDE 25 MG OR TABS
25.0000 mg | ORAL_TABLET | Freq: Every day | ORAL | 0 refills | Status: DC
Start: 2016-04-10 — End: 2016-07-03

## 2016-04-10 NOTE — Progress Notes (Signed)
[facility] Progress Note    Student(s): Tonye RoyaltyArvin Wali, MS4    Attending: Dr. Elder CyphersNatalie Rodriguez    SUBJECTIVE:  Felicia [name] is a 61 year old female with T2DM and hypothyroid presenting to clinic for medication refills and to discuss muscular neck pain and morning eye discharge since she had flu like symptoms two weeks prior.    #Neck Pain  Patient reports two weeks of improving muscular pain around her neck. Pain is aggregated with neck movements and does not cause any radiating pain to her arms, hands or fingers. She denies any weakness or loss of sensation. She would take ibuprofen but has run out of her home supply.    #T2DM  Patient is taking metformin 500 mg x 1 daily. Last HA1C 5.6 10/25/2015. Taking ASA 81mg . She periodically checks her feet. Denies any new urinary symptoms. She has altered her diet to remove sodas and tortillas. She has not been seen by opthalmology.    #Hypertension  Taking lisinopril 40 mg daily and metoprolol succinate 100mg  and hydrochlorothiazide daily. However, she ran out of her medications as of Saturday (5 days). She denies any chest pain or trouble breathing with activity.    #GERD  Ranitidine 150mg  GERD PRN. She only takes pain when having reflux symptoms after meals. She has not needed to use these medications in several months.    #HLD  Taking Atorvastatin 40mg  daily    #Hypothyroid  Taking synthroid 100mcg daily.    #Back Pain  Reports a history of sciatic back pain that radiates to both her legs. However, she watched a series of YouTube videos and learned home exercises to stretch her legs. She no longer reports sciatic pain and does not use her ibuprofen regularly. She reports the last episode of pain several months prior.           Current Outpatient Prescri  ptions   Medication Sig    aspirin 81 MG EC tablet Take 1 tablet (81 mg) by mouth daily.    atorvastatin (LIPITOR) 40 MG tablet Take 1 tablet (40 mg) by mouth daily.     hydrochlorothiazide (HYDRODIURIL) 25 MG tablet Take 1 tablet (25 mg) by mouth daily.    ibuprofen (MOTRIN) 200 MG tablet Take 2 tablets (400 mg) by mouth every 8 hours as needed for Mild Pain (Pain Score 1-3) or Moderate Pain (Pain Score 4-6). Take with food.    levothyroxine (SYNTHROID) 100 MCG tablet Take 1 tablet (100 mcg) by mouth every morning (before breakfast).    lisinopril (PRINIVIL, ZESTRIL) 40 MG tablet Take 1 tablet (40 mg) by mouth daily.    metFORMIN (GLUCOPHAGE) 500 MG tablet Take 1 tablet (500 mg) by mouth daily.    metoprolol succinate (TOPROL XL) 100 MG XL tablet Take 1 tablet (100 mg) by mouth daily.    ranitidine (ZANTAC) 150 MG tablet Take 1 tablet (150 mg) by mouth daily as needed for Heartburn.     No current facility-administered medications for this visit.        Review of Systems:  Positive for neck pain and eye discharge, otherwise denies CP, SOB, abdominal pain, back pain, weakness, loss of sensation.    OBJECTIVE:  BP 160/90 (BP Location: Left arm, BP Patient Position: Sitting, BP cuff size: Regular)   Pulse 88   Temp 98 F (36.7 C) (Oral)   Resp 16   Wt 83.5 kg (184 lb)   SpO2 95%  Gen: Normal speech with appropriate affect,  no acute distress  Lungs:  CTAB  CV:  RRR, no RMG  Abd: NTP, NBS  Ext: DP/TP 2+, feet without abrasions.   Neuro: Gross motor 5/5 in upper and lower extremities, sensation grossly symmetric and intact x4     Labs:  HA1C 09/2015 5.6    TSH 07/2015 0.96     PHQ 2/9:      Assessment and Plan:  Patient is 89 F with T2DM, HTN, GERD, HLD, Hypothyroid, Back Pain    #T2DM  Patient is taking metformin 500 mg x 1 daily. Last HA1C 5.6 10/25/2015. Taking ASA 81mg . Last HA1C indicates excellent glucose control. Will need follow up HA1C to reassess sugar control.  -Blood draws HA1C to assess for goal of 6-7  -If HA1C >6, continue metforming 500 daily  If HA1C<6 consider stopping metforming (patient may be in remission for diabetes)  - Draw BMP 06/2016  - Draw  microalbumin/Cr 06/2016    #Hypertension  Taking lisinopril 40 mg daily and Metoprolol succinate 100mg  daily. However, not taking medications since Saturday. Hypertensive in 150s-160s today.   -Continue lisinopril 40 mg  -Continue metoprolol 40 mg  - Continue HCTZ 25 mg  -Follow up BP for optimal BP control    #GERD  Ranitidine 150mg  GERD PRN. Symptoms well controlled.  -Continue ranitidine 150mg  PRN    #HLD  Taking Atorvastatin 40mg  daily for diabetes ASCVD prophylaxis.   -Continue atorvastatin 40mg     #Hypothyroid  Taking synthroid daily. No symptoms of hyperthyroid. TSH 0.96. At goal  -Continue synthroid daily  -Redraw TSH today    #Back Pain with radiating features  On Ibuprofen 400mg  x2/week. Using home exercises, no longer has sciatic pain  -Can continue ibuprofen 400 mg PRN    #Neck Pain  Likely musculoskeletal in nature/post viral infection. Pain getting worse and without alarming neurologic features.  - Continue ibuprofen 400mg  PRN for pain    #Eye Discharge  Symptoms improving since flu like episode 2 weeks prior.  - Encourage washing eyes and supportive management.  Most likely self limiting.    Health Care Maintenance:  Draw HA1C and TSH today  Referral for Optho placed    Contraception:  Post-menopausal    Immunizations:  Influenza completed 12/2015    Cancer Screening:  Mammogram due - referral placed on 04/10/2016    Patient to RTC on:  07/03/2016    To do items at next visit:  -Follow up HA1C, see if at goal of >6 (if <6 can consider stopping metformin)  -Follow up TSH  - Draw microalbumin/creatinine  - Draw BMP next visit  -Follow up optho/mammogram visit    Patient seen, discussed and examined with Dr. Chevis Pretty, MS4

## 2016-04-12 LAB — GLYCOSYLATED HGB(A1C), BLOOD: Hgb A1C: 5.5 %{Hb} (ref <5.7)

## 2016-04-12 LAB — TSH, BLOOD: TSH: 63.87 m[IU]/L — ABNORMAL HIGH (ref 0.40–4.50)

## 2016-04-19 ENCOUNTER — Telehealth: Payer: Self-pay | Admitting: Family Practice

## 2016-04-19 NOTE — Telephone Encounter (Signed)
Called pt and informed TSH very elevated at 63 and she needs to take her levothyroxine every day regularly for 6 weeks and then recheck as she was very well controlled before.    On review of record - she received 90 days of levothyroxine 4 months earlier so she likely ran out or didn't take full dose.  Will not adjust dose until confirm taking correct dose.    Rebeca AlertSunny Jenicka Coxe MD

## 2016-04-26 NOTE — Progress Notes (Signed)
Attending Note:  Pt seen and examined with MS4. I agree with assessment and plan.  Andreus Cure, M.D.  [id number]

## 2016-05-09 NOTE — Progress Notes (Signed)
Attending Note:    Seen with medical student and agree with their note.    Amunique Neyra, MD

## 2016-05-22 ENCOUNTER — Encounter: Payer: Self-pay | Admitting: Ophthalmology

## 2016-07-03 ENCOUNTER — Ambulatory Visit: Payer: Self-pay | Admitting: Family Medicine

## 2016-07-03 ENCOUNTER — Other Ambulatory Visit: Payer: Self-pay | Admitting: Family Practice

## 2016-07-03 DIAGNOSIS — E039 Hypothyroidism, unspecified: Secondary | ICD-10-CM

## 2016-07-03 DIAGNOSIS — I10 Essential (primary) hypertension: Secondary | ICD-10-CM

## 2016-07-03 DIAGNOSIS — E119 Type 2 diabetes mellitus without complications: Principal | ICD-10-CM

## 2016-07-03 DIAGNOSIS — E785 Hyperlipidemia, unspecified: Secondary | ICD-10-CM

## 2016-07-03 DIAGNOSIS — E1169 Type 2 diabetes mellitus with other specified complication: Secondary | ICD-10-CM

## 2016-07-03 MED ORDER — METFORMIN HCL 500 MG OR TABS
500.0000 mg | ORAL_TABLET | Freq: Every day | ORAL | 0 refills | Status: DC
Start: 2016-07-03 — End: 2016-09-25

## 2016-07-03 MED ORDER — ASPIRIN EC 81 MG OR TBEC (CUSTOM)
81.0000 mg | DELAYED_RELEASE_TABLET | Freq: Every day | ORAL | 0 refills | Status: DC
Start: 2016-07-03 — End: 2016-09-25

## 2016-07-03 MED ORDER — ATORVASTATIN CALCIUM 40 MG OR TABS
40.0000 mg | ORAL_TABLET | Freq: Every day | ORAL | 0 refills | Status: DC
Start: 2016-07-03 — End: 2016-09-25

## 2016-07-03 MED ORDER — HYDROCHLOROTHIAZIDE 25 MG OR TABS
25.0000 mg | ORAL_TABLET | Freq: Every day | ORAL | 0 refills | Status: DC
Start: 2016-07-03 — End: 2016-09-25

## 2016-07-03 MED ORDER — LEVOTHYROXINE SODIUM 100 MCG OR TABS
100.0000 ug | ORAL_TABLET | Freq: Every day | ORAL | 0 refills | Status: DC
Start: 2016-07-03 — End: 2016-07-10

## 2016-07-03 MED ORDER — METOPROLOL SUCCINATE 100 MG OR TB24
100.0000 mg | ORAL_TABLET | Freq: Every day | ORAL | 0 refills | Status: DC
Start: 2016-07-03 — End: 2016-09-25

## 2016-07-03 MED ORDER — LISINOPRIL 40 MG OR TABS
40.0000 mg | ORAL_TABLET | Freq: Every day | ORAL | 0 refills | Status: DC
Start: 2016-07-03 — End: 2016-09-25

## 2016-07-03 NOTE — Progress Notes (Signed)
[facility] Progress Note    Attending: Sherlon Handing      SUBJECTIVE:  Felicia [name] is a 61 year old female w/ hx of well controlled DM, HTN and hypothyroidism here for f/u  1. DM: Adherent with metfromin 500 mg daily. Does not check home glucose values. Denies any hypoglycemia. Tries to eat healthy and limit sugary beverages and carbs. Only doing minimal exercise - walks 15- 20 mins 3x/week  2. HTN: no side effects with medications. Denies CP/SOB/Palpitations  3. Hypothyroidism: adherent with levothyroxine QAM fasting. Denies constipation, heat/cold intolerance, skin/hair changes, or changes in mood.    Otherwise pt states that she is doing well  Has no other complaints today      Current Outpatient Prescriptions   Medication Sig    aspirin 81 MG EC tablet Take 1 tablet (81 mg) by mouth daily.    atorvastatin (LIPITOR) 40 MG tablet Take 1 tablet (40 mg) by mouth daily.    hydrochlorothiazide (HYDRODIURIL) 25 MG tablet Take 1 tablet (25 mg) by mouth daily.    ibuprofen (MOTRIN) 200 MG tablet Take 2 tablets (400 mg) by mouth every 8 hours as needed for Mild Pain (Pain Score 1-3) or Moderate Pain (Pain Score 4-6). Take with food.    levothyroxine (SYNTHROID) 100 MCG tablet Take 1 tablet (100 mcg) by mouth every morning (before breakfast).    lisinopril (PRINIVIL, ZESTRIL) 40 MG tablet Take 1 tablet (40 mg) by mouth daily.    metFORMIN (GLUCOPHAGE) 500 MG tablet Take 1 tablet (500 mg) by mouth daily.    metoprolol succinate (TOPROL XL) 100 MG XL tablet Take 1 tablet (100 mg) by mouth daily.    ranitidine (ZANTAC) 150 MG tablet Take 1 tablet (150 mg) by mouth daily as needed for Heartburn.     No current facility-administered medications for this visit.        OBJECTIVE:  BP 130/85 (BP Location: Right arm, BP Patient Position: Sitting)   Pulse 80   Temp 98.1 F (36.7 C) (Oral)   Resp 18   Wt 83 kg (183 lb)   SpO2 98%  Gen: WD obese female NAD  Lungs: CTAB  CV: RRR w/out M/R/G  Abd: soft  NTND NABS  Ext: no edema. Normal monofilament exam    Labs:  Lab Results   Component Value Date    A1C 5.5 04/10/2016    A1C 6.9 (H) 02/01/2015     Lab Results   Component Value Date    NA 138 07/05/2015    NA 137 02/01/2015    K 4.4 07/05/2015    K 4.2 02/01/2015    CL 103 07/05/2015    CL 102 02/01/2015    BICARB 23 07/05/2015    BICARB 26 02/01/2015    BUN 20 07/05/2015    BUN 25 02/01/2015    CREAT 0.59 07/05/2015    CREAT 0.80 02/01/2015    GLU 76 07/05/2015    GLU 109 (H) 02/01/2015    [place] 9.4 07/05/2015    [place] 9.7 02/01/2015     Lab Results   Component Value Date    CHOL 123 (L) 07/05/2015    HDL 46 07/05/2015    TRIG 098 07/05/2015     Lab Results   Component Value Date    TSH 63.87 (H) 04/10/2016    TSH 4.48 02/01/2015       Micro/Cr 07/05/15: 3    [place] PHQ9 DEPRESSION QUESTIONNAIRE 05/11/2014 05/11/2014 08/03/2014 02/01/2015 04/26/2015  07/19/2015 07/03/2016   Interest 2 2 0 0 0 0 0   Depressed 0 0 0 0 0 0 0   Sleep -- -- -- -- -- -- --   Energy -- -- -- -- -- -- --   Appetite -- -- -- -- -- -- --   Failure -- -- -- -- -- -- --   Concentration -- -- -- -- -- -- --   Movement -- -- -- -- -- -- --   Suicide -- -- -- -- -- -- --   Summary(Manual) -- -- -- -- -- -- --   Summary(Calculated) -- -- -- -- -- -- --   Functional -- -- -- -- -- -- --       Assessment and Plan:  61 yr old female w/ hx of HTN, DM and hypothyroidism - doing well  1. DM: excellent control  - Cont. Metformin 500 mg Qday  - Cont. ASA and Atorvastatin for cardioprotection  - Repeat A1C Q 6 months  - If A1C remains in pre DM range, could consider d/c metformin and see if pt can be maintained with diet control  - labs up to date  - ophtho referral authorized (has not been seen by our clinic since initiating care with us)    2. HTN: near goal but persistently elevated diastolic at recent visit  - Cont. HCTZ, lisinopril and metoprolol  - Discussed with pt possibility of adding 4th agent, but she opted to work on diet/exercise till next visit and reassess BP  control at that time    3. Hypothyroidism: TSH profoundly elevated at last visit - it was unclear if pt was taking levothyroxine as directed at that time, but she confirmed today that she is taking it correctly  - Cont. Levothyroxine 100 mcg daily  - Only 7 tabs available from pharmacy today so med only visit scheduled in 1 week  - TSH ordered today - will review results in 1 week to ensure pt is on appropriate dose    4. Health Care Maintenance:  Contraception: N/A (postmenopasual)  Immunizations: needs prevnar and Tdap when available  Cancer Screening: Never done FOBT - given tonight to return in 1 week. Up to date with PAP. Due for mammogram - given number to Abbey ChattersKing Chavez to call for [facility] appt.     Patient to RTC 1 week for:  Review of TSH  Refill of levothyroxine  FOBT    Otherwise pt to RTC 3 months for f/u      To do items at next visit:  Did pt have well woman exam  Given prevnar and Tdap when available      N. Sherlon Handingodriguez, M.D.

## 2016-07-05 LAB — TSH, BLOOD: TSH: 72.62 mIU/L — ABNORMAL HIGH (ref 0.40–4.50)

## 2016-07-10 ENCOUNTER — Encounter: Payer: Self-pay | Admitting: Family Practice

## 2016-07-10 ENCOUNTER — Encounter: Payer: Self-pay | Admitting: Family Medicine

## 2016-07-10 DIAGNOSIS — E039 Hypothyroidism, unspecified: Principal | ICD-10-CM

## 2016-07-10 MED ORDER — LEVOTHYROXINE SODIUM 125 MCG OR TABS
125.0000 ug | ORAL_TABLET | Freq: Every day | ORAL | 0 refills | Status: DC
Start: 2016-07-10 — End: 2016-09-12

## 2016-07-10 MED ORDER — LEVOTHYROXINE SODIUM 100 MCG OR TABS
100.0000 ug | ORAL_TABLET | Freq: Every day | ORAL | 0 refills | Status: DC
Start: 2016-07-10 — End: 2016-07-10

## 2016-07-10 NOTE — Progress Notes (Signed)
6/13/18[name]- Felicia [name] MS1    FOBT test result is read as negative.

## 2016-07-10 NOTE — Progress Notes (Signed)
Pt here for refill of levothyroxine today  Pharmacy did not have enough to dispense at last visit.  Pt also had TSH drawn which is significantly elevated at   Lab Results   Component Value Date    TSH 72.62 (H) 07/03/2016    TSH 4.48 02/01/2015     Increase dose of levothyroxine to 125 mcg daily    Stressed importance of taking daily at least 30 minutes before eating in the AM.     Pt to RTC 6 wks for repeat Myna BrightSH    N. Emilio Baylock, M.D.

## 2016-08-21 ENCOUNTER — Encounter: Payer: Self-pay | Admitting: Ophthalmology

## 2016-08-21 ENCOUNTER — Encounter: Payer: Self-pay | Admitting: Family Practice

## 2016-08-28 ENCOUNTER — Encounter: Payer: Self-pay | Admitting: Family Medicine

## 2016-08-28 ENCOUNTER — Other Ambulatory Visit: Payer: Self-pay | Admitting: Family Practice

## 2016-08-30 LAB — BASIC METABOLIC PANEL, BLOOD
BUN/Creatinine Ratio: 26 (calc) — ABNORMAL HIGH (ref 6–22)
BUN: 29 mg/dL — ABNORMAL HIGH (ref 7–25)
Calcium: 9.7 mg/dL (ref 8.6–10.4)
Carbon Dioxide: 26 mmol/L (ref 20–31)
Chloride: 105 mmol/L (ref 98–110)
Creatinine: 1.13 mg/dL — ABNORMAL HIGH (ref 0.50–0.99)
Glucose: 82 mg/dL (ref 65–99)
Potassium: 4.3 mmol/L (ref 3.5–5.3)
Sodium: 141 mmol/L (ref 135–146)
eGFR African American: 60 mL/min/{1.73_m2} (ref >=60)
eGFR non-Afr.American: 52 mL/min/{1.73_m2} — ABNORMAL LOW (ref >=60)

## 2016-08-30 LAB — TSH, BLOOD: TSH: 29.94 mIU/L — ABNORMAL HIGH (ref 0.40–4.50)

## 2016-09-11 ENCOUNTER — Ambulatory Visit: Payer: Self-pay | Admitting: Ophthalmology

## 2016-09-11 DIAGNOSIS — E119 Type 2 diabetes mellitus without complications: Principal | ICD-10-CM

## 2016-09-11 NOTE — Progress Notes (Signed)
[facility] Specialty Clinic Note  Felicia [name]  06/04/1955  [id number]  09/11/16      Reason for Referral:    SUBJECTIVE::Felicia [name] is a 61 year old female with h/o HLD, HTN, hypothyrodism, GERD and T2DM w/o long term insuline use here for a diabetic retinopathy screen.  She complains of seeing stars and a headache when standing upright, and last had a vision exam 1 year ago. Denies flahses, curtaining, but has noticed more floaters in the past 2 months. Last HgbA1c was 5.5 on 04/10/16.    Past Medical History  Past Medical History:   Diagnosis Date    Back pain at L4-L5 level 11/2014    Diabetes mellitus type 2, controlled, without complications (CMS-HCC)     GERD (gastroesophageal reflux disease)     Hyperlipidemia associated with type 2 diabetes mellitus (CMS-HCC)     Hypertension     Hypothyroidism     Obesity        Medications/Allergies:     Current Outpatient Prescriptions   Medication Sig    aspirin 81 MG EC tablet Take 1 tablet (81 mg) by mouth daily.    atorvastatin (LIPITOR) 40 MG tablet Take 1 tablet (40 mg) by mouth daily.    hydrochlorothiazide (HYDRODIURIL) 25 MG tablet Take 1 tablet (25 mg) by mouth daily.    ibuprofen (MOTRIN) 200 MG tablet Take 2 tablets (400 mg) by mouth every 8 hours as needed for Mild Pain (Pain Score 1-3) or Moderate Pain (Pain Score 4-6). Take with food.    levothyroxine (SYNTHROID) 125 MCG tablet Take 1 tablet (125 mcg) by mouth every morning (before breakfast).    lisinopril (PRINIVIL, ZESTRIL) 40 MG tablet Take 1 tablet (40 mg) by mouth daily.    metFORMIN (GLUCOPHAGE) 500 MG tablet Take 1 tablet (500 mg) by mouth daily.    metoprolol succinate (TOPROL XL) 100 MG XL tablet Take 1 tablet (100 mg) by mouth daily.    ranitidine (ZANTAC) 150 MG tablet Take 1 tablet (150 mg) by mouth daily as needed for Heartburn.     No current facility-administered medications for this visit.        No Known Allergies    Family History      Problem Relation Age of Onset    Cancer           Exam    Vision          Distance: 20/ 20 OD   Cc  20/20 OS     Near:  20/25 -2OD   Cc  20/20    OS     Pupillary Reflex: PERRL    EOM: EOMI  CVF: Normal    IOP (tono)  14 OD   18 OS     Slit Lamp   LL: Dermatochalasis OU  CS: Non-inflamed  K: clear OU  AC: deep and quiet OU  Iris: round and dilated OU, no NVI OU  Lens: wnl OU    Dilated Fundus Exam  Disc:  pink/sharp/flat OU, no neovascularization     Macula : flat, no exudates/hemorrhage or edema OU   Vessels/Periphery: No T2DM retinopathy, no holes rips or tears           Assessment/Plan:  Felicia [name] is a 61 year old female with h/o HLD, HTN, hypothyrodism, GERD and T2DM w/o long term insuline use here for a diabetic retinopathy screen.     #T2DM  - no diabetic retinopathy  -  continue blood glucose and pressure control  - follow up in 2 years    #Orthostatic hypotension  - endorse fluids      Attending: Westley ChandlerAndrew Camp, MD  Note written by medical student, Ileene PatrickBojing Nolen Lindamood, MSII

## 2016-09-12 ENCOUNTER — Encounter: Payer: Self-pay | Admitting: Family Medicine

## 2016-09-12 DIAGNOSIS — E039 Hypothyroidism, unspecified: Principal | ICD-10-CM

## 2016-09-12 MED ORDER — LEVOTHYROXINE SODIUM 150 MCG OR TABS
150.00 ug | ORAL_TABLET | Freq: Every day | ORAL | Status: DC
Start: ? — End: 2016-09-25

## 2016-09-12 NOTE — Progress Notes (Signed)
Labs reviewed in EPIC  Significant for persistent although improved TSH   Lab Results   Component Value Date    TSH 29.94 (H) 08/28/2016    TSH 4.48 02/01/2015     Previously 72.62  Will increase dose of levothyroxine to 150 mcg daily    Pt to RTC as directed for f/u - has f/u scheduled in 1 week. Will disp med at that time.      Felicia NeptuneN. Allyana Vogan, M.D.  [id number]

## 2016-09-25 ENCOUNTER — Encounter: Payer: Self-pay | Admitting: Family Practice

## 2016-09-25 ENCOUNTER — Ambulatory Visit: Payer: Self-pay | Admitting: Family Practice

## 2016-09-25 VITALS — BP 98/56 | HR 62 | Temp 98.0°F | Resp 18 | Wt 174.0 lb

## 2016-09-25 DIAGNOSIS — K219 Gastro-esophageal reflux disease without esophagitis: Secondary | ICD-10-CM

## 2016-09-25 DIAGNOSIS — E119 Type 2 diabetes mellitus without complications: Secondary | ICD-10-CM

## 2016-09-25 DIAGNOSIS — E039 Hypothyroidism, unspecified: Principal | ICD-10-CM

## 2016-09-25 DIAGNOSIS — E785 Hyperlipidemia, unspecified: Secondary | ICD-10-CM

## 2016-09-25 DIAGNOSIS — E1169 Type 2 diabetes mellitus with other specified complication: Secondary | ICD-10-CM

## 2016-09-25 DIAGNOSIS — I10 Essential (primary) hypertension: Secondary | ICD-10-CM

## 2016-09-25 MED ORDER — LISINOPRIL 40 MG OR TABS
40.0000 mg | ORAL_TABLET | Freq: Every day | ORAL | 0 refills | Status: DC
Start: 2016-09-25 — End: 2016-12-25

## 2016-09-25 MED ORDER — METFORMIN HCL 500 MG OR TABS
500.0000 mg | ORAL_TABLET | Freq: Every day | ORAL | 0 refills | Status: DC
Start: 2016-09-25 — End: 2016-12-25

## 2016-09-25 MED ORDER — ASPIRIN EC 81 MG OR TBEC (CUSTOM)
81.0000 mg | DELAYED_RELEASE_TABLET | Freq: Every day | ORAL | 0 refills | Status: DC
Start: 2016-09-25 — End: 2016-12-25

## 2016-09-25 MED ORDER — HYDROCHLOROTHIAZIDE 25 MG OR TABS
25.0000 mg | ORAL_TABLET | Freq: Every day | ORAL | 0 refills | Status: DC
Start: 2016-09-25 — End: 2016-12-25

## 2016-09-25 MED ORDER — LEVOTHYROXINE SODIUM 150 MCG OR TABS
150.0000 ug | ORAL_TABLET | Freq: Every day | ORAL | 0 refills | Status: DC
Start: 2016-09-25 — End: 2016-12-25

## 2016-09-25 MED ORDER — METOPROLOL SUCCINATE 100 MG OR TB24
100.0000 mg | ORAL_TABLET | Freq: Every day | ORAL | 0 refills | Status: DC
Start: 2016-09-25 — End: 2016-09-25

## 2016-09-25 MED ORDER — RANITIDINE HCL 150 MG OR TABS
150.0000 mg | ORAL_TABLET | Freq: Every day | ORAL | 0 refills | Status: DC | PRN
Start: 2016-09-25 — End: 2016-12-25

## 2016-09-25 MED ORDER — ATORVASTATIN CALCIUM 40 MG OR TABS
40.0000 mg | ORAL_TABLET | Freq: Every day | ORAL | 0 refills | Status: DC
Start: 2016-09-25 — End: 2016-12-25

## 2016-09-25 NOTE — Progress Notes (Signed)
Medical Student Progress Note  Attending: Dr. Katrinka Blazing, MD    Judeth Horn   MRN: [id number]   DOB: April 08, 1955   Date of care: 09/25/2016      CC: hypothyroidism    Subjective:   Cyd Hostler is a 61 year old female with past medical history of hypothyroidism, T2DM, and HTN here today for follow-up.    #hypothyroidism: TSH 29.94 (08/2016), has been elevated since 03/2016, now downtrending as Levothyroxine was increased to 125 mcg. Patient has been symptomatic with significant hair loss, fatigue, feeling down some days, and cold sensitivity. Reports good compliance with her medication.    #DM2: A1c 5.5 (03/2016) on Metformin 500mg  qday. Eating a superb diet of beans, very limited rice, lots of vegetables, and lean proteins such as salmon and chicken. Has lost 10 lbs since 03/2016! Saw ophtho 08/2016, no diabetic retinopathy.     #HTN: Not checking BP at home. BP 98/56 today, noted to be low at ophtho visit earlier this month as well. Was 130/85 at visit in June. Practices positive thinking and healthy diet as above to work towards reversing her HTN.    Allergies: Review of patient's allergies indicates no known allergies.  Current Outpatient Prescriptions on File Prior to Visit   Medication Sig Dispense Refill    [DISCONTINUED] aspirin 81 MG EC tablet Take 1 tablet (81 mg) by mouth daily. 90 tablet 0    [DISCONTINUED] atorvastatin (LIPITOR) 40 MG tablet Take 1 tablet (40 mg) by mouth daily. 90 tablet 0    [DISCONTINUED] hydrochlorothiazide (HYDRODIURIL) 25 MG tablet Take 1 tablet (25 mg) by mouth daily. 90 tablet 0    ibuprofen (MOTRIN) 200 MG tablet Take 2 tablets (400 mg) by mouth every 8 hours as needed for Mild Pain (Pain Score 1-3) or Moderate Pain (Pain Score 4-6). Take with food. 120 tablet 0    [DISCONTINUED] levothyroxine (SYNTHROID) 150 MCG tablet Take 150 mcg by mouth every morning (before breakfast).      [DISCONTINUED] lisinopril (PRINIVIL, ZESTRIL) 40 MG tablet Take 1 tablet (40 mg) by mouth  daily. 90 tablet 0    [DISCONTINUED] metFORMIN (GLUCOPHAGE) 500 MG tablet Take 1 tablet (500 mg) by mouth daily. 90 tablet 0    [DISCONTINUED] metoprolol succinate (TOPROL XL) 100 MG XL tablet Take 1 tablet (100 mg) by mouth daily. 90 tablet 0    [DISCONTINUED] ranitidine (ZANTAC) 150 MG tablet Take 1 tablet (150 mg) by mouth daily as needed for Heartburn. 90 tablet 0     No current facility-administered medications on file prior to visit.        Objective:   Vitals:    09/25/16 1709   BP: 98/56   BP Location: Right arm   BP Patient Position: Sitting   BP cuff size: Regular   Pulse: 62   Resp: 18   Temp: 98 F (36.7 C)   TempSrc: Oral   Weight: 78.9 kg (174 lb)     Date Weight Recorded 09/25/2016 07/03/2016 04/10/2016 10/18/2015 04/26/2015 08/03/2014   Metric 78.926 kg 83.008 kg 83.462 kg 82.101 kg 86.637 kg 87.544 kg   Pounds/Ounces 174 lb 183 lb 184 lb 181 lb 191 lb 193 lb          [place] PHQ9 DEPRESSION QUESTIONNAIRE 05/11/2014 05/11/2014 08/03/2014 02/01/2015 04/26/2015 07/19/2015 07/03/2016   Interest 2 2 0 0 0 0 0   Depressed 0 0 0 0 0 0 0   Sleep -- -- -- -- -- -- --  Energy -- -- -- -- -- -- --   Appetite -- -- -- -- -- -- --   Failure -- -- -- -- -- -- --   Concentration -- -- -- -- -- -- --   Movement -- -- -- -- -- -- --   Suicide -- -- -- -- -- -- --   Summary(Manual) -- -- -- -- -- -- --   Summary(Calculated) -- -- -- -- -- -- --   Functional -- -- -- -- -- -- --       Physical Exam   Gen - well-appearing woman in no acute distress  Cardio - RRR, no m/r/g  Lungs - CTAB, good air movement througout  Extremities - moving all extremities equally, normal gait    Labs 08/2016  TSH 29.94 <--72.62 (06/2016) <--63.87 (03/2016) <--0.96 (07/2015)  Na 141  K 4.3  Cl 105  Bicarb 26  BUN 29  Cr 1.13  Glucose 82  BUN/Cr 26    A1c (03/2016) 5.5    FOBT (06/2016): Negative  Pap (08/2016): Negative  Mammo (08/2016): Results pending    Assessment/Plan:   Jazzlynne Amis is a 61 year old female with past medical history of hypothyroidism,  T2DM, and HTN here today for follow-up.    #hypothyroidism: TSH 29.94 (08/2016), has been elevated since 03/2016, now downtrending as Levothyroxine was increased to 125 mcg, however still far above goal. Patient has been symptomatic with significant hair loss, fatigue, feeling down some days, and cold sensitivity. Reports good compliance with her medication.  -increase Levothyroxine to 150 mcg today  -labs only visit scheduled for 11/06/2016, repeat TSH with reflex to T4    #DM2: Stable. A1c 5.5 (03/2016) on Metformin 500mg  qday. Patient has been working on diet with 10-lb weight loss since 03/2016. Saw ophtho 08/2016, no diabetic retinopathy.   -continue Metformin 500mg  qday  -repeat A1c 03/2017  -continue lifestyle management; if 03/2017 A1c stable or lower, consider d/c Metformin    #HTN: Improved. BP 98/56 today, noted to be low at ophtho visit earlier this month as well, has been downtrending since 03/2016 likely 2/2 significant weight loss. Practices positive thinking and healthy diet as above to work towards reversing her HTN.  -d/c Metoprolol 100mg  qday given hypotension and orthostasis as well as no additional indication for beta blocker and these are not preferred 1st line anti-HTN agents  -continue HCTZ 25mg  qday  -continue Lisinopril 40mg  qday  -continue lifestyle management    # HCM  -Mammo 08/2016 results pending (f/u results next visit) --> due 08/2017  -Pap 08/2016 negative --> due 08/2019  -FOBT 06/2016 negative --> due 06/2017  -Diabetic eye exam 08/2016 --> due 08/2018  -Needs Prevnar and Tdap (not available in clinic today, f/u availability at future visits)  -Needs HIV screen  -Needs HepC screen given born between 1945 and 1965    Labs to obtain at labs only visit in 6 wks:   [ ]  TSH with reflex to T4  [ ]  BMP  [ ]  HIV screen  [ ]  HepC screen    To do at 3-mo f/u:  [ ]  f/u mammo results  [ ]  f/u TSH, consider increasing Levothyroxine    RTC 6 wks for labs only, 3 months for f/u    Kellie Moor, [facility]      09/25/2016 6:07 PM

## 2016-11-06 ENCOUNTER — Encounter: Payer: Self-pay | Admitting: Student in an Organized Health Care Education/Training Program

## 2016-11-06 NOTE — Progress Notes (Signed)
Patient arrived for labs and had question regarding her current BP meds. She was unsure if she should still be taking metoprolol  daily however advised to stop at her last visit in Aug 2018 due to side effects. Advised patient on current recommendations to discontinue med (she has stopped now), she verbalized understanding of plan. Has scheduled follow up in 28 Nov for lab review, patient understands and agrees with plan.      Premarin arrived at pharm available at next clinic visit.

## 2016-11-07 ENCOUNTER — Other Ambulatory Visit: Payer: Self-pay | Admitting: Family Practice

## 2016-11-08 LAB — HEPATITIS C AB, BLOOD
Hepatis C AB Signal/Cut Off: 0.03 (ref <1.00)
Hepatitis C Ab: NONREACTIVE

## 2016-11-08 LAB — BASIC METABOLIC PANEL, BLOOD
BUN: 21 mg/dL (ref 7–25)
Calcium: 9.8 mg/dL (ref 8.6–10.4)
Carbon Dioxide: 22 mmol/L (ref 20–32)
Chloride: 103 mmol/L (ref 98–110)
Creatinine: 0.67 mg/dL (ref 0.50–0.99)
Glucose: 81 mg/dL (ref 65–99)
Potassium: 4.7 mmol/L (ref 3.5–5.3)
Sodium: 138 mmol/L (ref 135–146)
eGFR African American: 108 mL/min/{1.73_m2} (ref >=60)
eGFR non-Afr.American: 94 mL/min/{1.73_m2} (ref >=60)

## 2016-11-08 LAB — HIV 1/2 ANTIBODY & P24 ANTIGEN ASSAY, BLOOD: HIV AG/AB, 4th Gen: NONREACTIVE

## 2016-11-08 LAB — TSH W/REFLEX TO FT4-QUEST: TSH: 0.65 mIU/L (ref 0.40–4.50)

## 2016-12-25 ENCOUNTER — Encounter: Payer: Self-pay | Admitting: Family Practice

## 2016-12-25 ENCOUNTER — Ambulatory Visit: Payer: Self-pay | Admitting: Family Medicine

## 2016-12-25 DIAGNOSIS — E785 Hyperlipidemia, unspecified: Secondary | ICD-10-CM

## 2016-12-25 DIAGNOSIS — E1169 Type 2 diabetes mellitus with other specified complication: Secondary | ICD-10-CM

## 2016-12-25 DIAGNOSIS — M545 Low back pain, unspecified: Secondary | ICD-10-CM

## 2016-12-25 DIAGNOSIS — E119 Type 2 diabetes mellitus without complications: Principal | ICD-10-CM

## 2016-12-25 DIAGNOSIS — E039 Hypothyroidism, unspecified: Secondary | ICD-10-CM

## 2016-12-25 DIAGNOSIS — I10 Essential (primary) hypertension: Secondary | ICD-10-CM

## 2016-12-25 DIAGNOSIS — K219 Gastro-esophageal reflux disease without esophagitis: Secondary | ICD-10-CM

## 2016-12-25 MED ORDER — ASPIRIN EC 81 MG OR TBEC (CUSTOM)
81.0000 mg | DELAYED_RELEASE_TABLET | Freq: Every day | ORAL | 0 refills | Status: DC
Start: 2016-12-25 — End: 2017-03-19

## 2016-12-25 MED ORDER — RANITIDINE HCL 150 MG OR TABS
150.0000 mg | ORAL_TABLET | Freq: Every day | ORAL | 0 refills | Status: DC | PRN
Start: 2016-12-25 — End: 2017-03-19

## 2016-12-25 MED ORDER — HYDROCHLOROTHIAZIDE 25 MG OR TABS
25.0000 mg | ORAL_TABLET | Freq: Every day | ORAL | 0 refills | Status: DC
Start: 2016-12-25 — End: 2017-03-19

## 2016-12-25 MED ORDER — ATORVASTATIN CALCIUM 40 MG OR TABS
40.0000 mg | ORAL_TABLET | Freq: Every day | ORAL | 0 refills | Status: DC
Start: 2016-12-25 — End: 2017-03-19

## 2016-12-25 MED ORDER — METFORMIN HCL 500 MG OR TABS
500.0000 mg | ORAL_TABLET | Freq: Every day | ORAL | 0 refills | Status: DC
Start: 2016-12-25 — End: 2017-03-19

## 2016-12-25 MED ORDER — LEVOTHYROXINE SODIUM 150 MCG OR TABS
150.0000 ug | ORAL_TABLET | Freq: Every day | ORAL | 0 refills | Status: DC
Start: 2016-12-25 — End: 2017-03-19

## 2016-12-25 MED ORDER — LISINOPRIL 40 MG OR TABS
40.0000 mg | ORAL_TABLET | Freq: Every day | ORAL | 0 refills | Status: DC
Start: 2016-12-25 — End: 2017-03-19

## 2016-12-25 MED ORDER — IBUPROFEN 200 MG OR TABS
400.0000 mg | ORAL_TABLET | Freq: Three times a day (TID) | ORAL | 0 refills | Status: DC | PRN
Start: 2016-12-25 — End: 2017-03-19

## 2016-12-25 NOTE — Interdisciplinary (Signed)
Felicia HornMaria Moreno [name] has high cholesterol, diabetes, high blood pressure, and thyroid problems. She came in today for a refill on medications. She told us that her thyroid problems used to be really bad in the past; her hair was falling out. The doctor upped her medication, she's been feeling better since; her hair is growing back. 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 She has a total of 5 children: four sons and one daughter. Two of her sons live out of the area. She lives with her husband, 963 year old son, and 63 year old son. Her daughter lives on the other side of the home with her husband. They pay $2,300 in rent. They used to live in an apartment before, Felicia [name] wanted to move out because they did not have enough storage room. She likes the house that they live in now because she has more privacy. Felicia [name] [place]. She confides in her friend that is also a patient at the clinic. They used to be neighbors in the apartment they used to live in. They used to hang out and go on walks together when they lived next to each other. Now, they live farther apart. When they come to clinic, they catch up on life. When asked about food insecurities, she said there are times when they do not have food. Felicia [name] said someone called her from the clinic to refer her to food banks. She said she has to stay in the house because her sons work. I observed that she deterred from most of the questions I asked her. When asked about stressors in life, she said the house she lives in is too cold.    Referred her to a few food banks in her area.    Felicia SellsMarlene Martinez: Social Work Intern  Approved by: Felicia [name], Felicia [name]

## 2016-12-25 NOTE — Progress Notes (Signed)
Medical Student Progress Note  Attending: Dr. Si GaulHu, MD    Felicia [name]   MRN: [id number]   DOB: Jan 18, 1956   Date of care:12/25/2016    Subjective:   Felicia [name] is a 61 year old female with past medical history of hypothyroidism, T2DM, and HTN here today for follow-up.    Hypothyroid  - Doing very well since increasing to 150 last visit.   - Reports increased energy, and hair is growing back  - TSH from 10/10 came back wnl  - Denies hyperthyroid sx such as palpitations, heart racing, sweatinbg    HTN  - Does not check at home  - Reports that since stopping metoprolol has not had any cases of orthostasis, dizziness or feeling faint  - Was told to decrease her Lisinopril from 40 to 20  - Denies headaches, weakness, chest pain, or shortness of breath  - States that she often has higher BP at clinic because she is nervous    DM2  - No acute changes  - Taking Metformin 500 qday      Allergies: Review of patient's allergies indicates no known allergies.      Current Outpatient Prescriptions:     aspirin 81 MG EC tablet, Take 1 tablet (81 mg) by mouth daily., Disp: 90 tablet, Rfl: 0    atorvastatin (LIPITOR) 40 MG tablet, Take 1 tablet (40 mg) by mouth daily., Disp: 90 tablet, Rfl: 0    hydrochlorothiazide (HYDRODIURIL) 25 MG tablet, Take 1 tablet (25 mg) by mouth daily., Disp: 90 tablet, Rfl: 0    ibuprofen (MOTRIN) 200 MG tablet, Take 2 tablets (400 mg) by mouth every 8 hours as needed for Mild Pain (Pain Score 1-3) or Moderate Pain (Pain Score 4-6). Take with food., Disp: 120 tablet, Rfl: 0    levothyroxine (SYNTHROID) 150 MCG tablet, Take 1 tablet (150 mcg) by mouth every morning (before breakfast)., Disp: 90 tablet, Rfl: 0    lisinopril (PRINIVIL, ZESTRIL) 40 MG tablet, Take 1 tablet (40 mg) by mouth daily., Disp: 90 tablet, Rfl: 0    metFORMIN (GLUCOPHAGE) 500 MG tablet, Take 1 tablet (500 mg) by mouth daily., Disp: 90 tablet, Rfl: 0    ranitidine (ZANTAC) 150 MG tablet, Take 1 tablet (150 mg)  by mouth daily as needed for Heartburn., Disp: 90 tablet, Rfl: 0      Objective:   Vitals:    12/25/16 1657   BP: 151/69   BP Location: Left arm   BP Patient Position: Sitting   BP cuff size: Regular   Pulse: 98   Resp: 18   Temp: 96.8 F (36 C)   TempSrc: Temporal Artery   SpO2: 98%   Weight: 80.7 kg (178 lb)   BP Recheck at end of visit - 160/80      Date Weight Recorded 12/25/2016 09/25/2016 07/03/2016 04/10/2016 10/18/2015 04/26/2015   Metric 80.74 kg 78.926 kg 83.008 kg 83.462 kg 82.101 kg 86.637 kg   Pounds/Ounces 178 lb 174 lb 183 lb 184 lb 181 lb 191 lb          Physical Exam   Gen - well-appearing woman in no acute distress, well developed, very pleasant  HEENT - thyroid not palpable  Cardio - RRR, no m/r/g  Lungs - CTAB, good air movement througout  Extremities - moving all extremities equally, normal gait  Skin - Hair has new growth, no evidence of peeling or dry skin    Labs   Lab Results  Component Value Date    TSH 0.65 11/07/2016    TSH 29.94 (H) 08/28/2016    TSH 4.48 02/01/2015     Lab Results   Component Value Date    NA 138 11/07/2016    NA 137 02/01/2015    K 4.7 11/07/2016    K 4.2 02/01/2015    CL 103 11/07/2016    CL 102 02/01/2015    BICARB 22 11/07/2016    BICARB 26 02/01/2015    BUN 21 11/07/2016    BUN 25 02/01/2015    CREAT 0.67 11/07/2016    CREAT 0.80 02/01/2015    GLU 81 11/07/2016    GLU 109 (H) 02/01/2015    [place] 9.8 11/07/2016    [place] 9.7 02/01/2015       A1c (03/2016) 5.5    FOBT (06/2016): Negative  Pap (08/2016): Negative  Mammo (08/2016): Negative    HIV (10/2016)- negative  Hep C (10/2016)- negative    Assessment/Plan:   Felicia [name] is a 61 year old female with past medical history of hypothyroidism, T2DM, and HTN here today for follow-up.    #hypothyroidism - TSH now wnl and she is symptotically much improved  - Continue levothyroxine 150  - Recheck TSH at next appointment    #. HTN - BP today was high, this is likely due to the decrease in lisinopril in combination with stopping  metoprolol, however she is no longer having orthostasis  - Increase Lisinopril to 40mg  qday, repeat Cr at next blood draw  - Continue HCTZ 25mg   - 2 week BP check, consider increase dosage if still high  - Encouraged [facility] to [facility] when she can    #. DM2 - Last A1c 5.5 (03/2016), well controlled, no acute issues  - Continue metformin 500mg  qday  - Continue ASA, Atorvastatin  - recheck A1c at next appointment and if normal consider d/c metformin    #. GERD - stable  - Continue Ranitidine      # HCM  -Mammo 08/2016--> due 08/2017  -Pap 08/2016 negative --> due 08/2019  -FOBT 06/2016 negative --> due 06/2017  -Diabetic eye exam 08/2016 --> due 08/2018  -Flu shot 12/25/2016  -Prevnar 08/07/2014  -HIV/HepC neg 11/07/2016  -Needs Tdap    F/u:   - 2 week BP check: 01/08/2017  - 3 month med refill and f/u: 03/19/2017    To do at next visit:  - labs: TSH/A1c/CMP  - f/u BP    Patient seen and discussed with attending, Dr. Si GaulHu.    Darnelle Bosheresa Whitchurch, MS4

## 2017-01-08 ENCOUNTER — Emergency Department
Admission: EM | Admit: 2017-01-08 | Discharge: 2017-01-08 | Disposition: A | Discharge status: Home / Self Care | Payer: Medicaid Other | Attending: Emergency Medicine | Admitting: Emergency Medicine

## 2017-01-08 ENCOUNTER — Emergency Department (EMERGENCY_DEPARTMENT_HOSPITAL): Payer: Medicaid Other

## 2017-01-08 ENCOUNTER — Emergency Department: Admit: 2017-01-08 | Payer: Self-pay

## 2017-01-08 ENCOUNTER — Ambulatory Visit: Payer: Self-pay | Admitting: Family Practice

## 2017-01-08 VITALS — BP 180/82 | HR 99 | Temp 95.7°F | Resp 18 | Wt 180.0 lb

## 2017-01-08 DIAGNOSIS — E119 Type 2 diabetes mellitus without complications: Secondary | ICD-10-CM

## 2017-01-08 DIAGNOSIS — R079 Chest pain, unspecified: Secondary | ICD-10-CM

## 2017-01-08 DIAGNOSIS — G458 Other transient cerebral ischemic attacks and related syndromes: Secondary | ICD-10-CM

## 2017-01-08 DIAGNOSIS — R202 Paresthesia of skin: Secondary | ICD-10-CM | POA: Insufficient documentation

## 2017-01-08 DIAGNOSIS — D72829 Elevated white blood cell count, unspecified: Secondary | ICD-10-CM | POA: Insufficient documentation

## 2017-01-08 DIAGNOSIS — I10 Essential (primary) hypertension: Secondary | ICD-10-CM | POA: Insufficient documentation

## 2017-01-08 DIAGNOSIS — E785 Hyperlipidemia, unspecified: Secondary | ICD-10-CM | POA: Insufficient documentation

## 2017-01-08 DIAGNOSIS — Z7982 Long term (current) use of aspirin: Secondary | ICD-10-CM | POA: Insufficient documentation

## 2017-01-08 DIAGNOSIS — E1169 Type 2 diabetes mellitus with other specified complication: Secondary | ICD-10-CM

## 2017-01-08 DIAGNOSIS — R200 Anesthesia of skin: Secondary | ICD-10-CM

## 2017-01-08 LAB — COMPREHENSIVE METABOLIC PANEL, BLOOD
ALT (SGPT): 36 U/L — ABNORMAL HIGH (ref 0–33)
AST (SGOT): 32 U/L (ref 0–32)
Albumin: 4.3 g/dL (ref 3.5–5.2)
Alkaline Phos: 121 U/L (ref 35–140)
Anion Gap: 13 mmol/L (ref 7–15)
BUN: 19 mg/dL (ref 8–23)
Bicarbonate: 24 mmol/L (ref 22–29)
Bilirubin, Tot: 0.35 mg/dL (ref <1.2)
Calcium: 9.6 mg/dL (ref 8.5–10.6)
Chloride: 96 mmol/L — ABNORMAL LOW (ref 98–107)
Creatinine: 0.51 mg/dL (ref 0.51–0.95)
GFR: >60 mL/min
Glucose: 113 mg/dL — ABNORMAL HIGH (ref 70–99)
Potassium: 4.2 mmol/L (ref 3.5–5.1)
Sodium: 133 mmol/L — ABNORMAL LOW (ref 136–145)
Total Protein: 7.4 g/dL (ref 6.0–8.0)

## 2017-01-08 LAB — TROPONIN T GEN 5 W/REFLEX TO CK/CKMB: Troponin T Gen 5 w/Reflex CK/CKMB: 6 ng/L (ref <14)

## 2017-01-08 LAB — CBC WITH DIFF, BLOOD
ANC-Automated: 6.1 10*3/uL (ref 1.6–7.0)
Abs Basophils: 0 10*3/uL (ref <0.1)
Abs Eosinophils: 0.1 10*3/uL (ref 0.1–0.5)
Abs Lymphs: 4.1 10*3/uL — ABNORMAL HIGH (ref 0.8–3.1)
Abs Monos: 0.6 10*3/uL (ref 0.2–0.8)
Basophils: 0 %
Eosinophils: 1 %
Hct: 40.7 % (ref 34.0–45.0)
Hgb: 13.7 gm/dL (ref 11.2–15.7)
Lymphocytes: 37 %
MCH: 31.7 pg (ref 26.0–32.0)
MCHC: 33.7 g/dL (ref 32.0–36.0)
MCV: 94.2 um3 (ref 79.0–95.0)
MPV: 10.4 fL (ref 9.4–12.4)
Monocytes: 6 %
Plt Count: 242 10*3/uL (ref 140–370)
RBC: 4.32 10*6/uL (ref 3.90–5.20)
RDW: 12.4 % (ref 12.0–14.0)
Segs: 56 %
WBC: 11 10*3/uL — ABNORMAL HIGH (ref 4.0–10.0)

## 2017-01-08 LAB — PRO BNP, BLOOD: BNPP: 8 pg/mL (ref 0–899)

## 2017-01-08 MED ORDER — ACETAMINOPHEN 325 MG PO TABS
975.00 mg | ORAL_TABLET | Freq: Once | ORAL | Status: AC
Start: 2017-01-08 — End: 2017-01-08
  Administered 2017-01-08: 975 mg via ORAL
  Filled 2017-01-08: qty 3

## 2017-01-08 MED ORDER — MORPHINE SULFATE 4 MG/ML IJ SOLN
4.0000 mg | Freq: Once | INTRAMUSCULAR | Status: DC
Start: 2017-01-08 — End: 2017-01-08

## 2017-01-08 MED ORDER — ASPIRIN 81 MG OR CHEW
324.00 mg | CHEWABLE_TABLET | Freq: Once | ORAL | Status: AC
Start: 2017-01-08 — End: 2017-01-08
  Administered 2017-01-08: 324 mg via ORAL
  Filled 2017-01-08: qty 4

## 2017-01-08 NOTE — Progress Notes (Deleted)
Medical Student Progress Note  Attending: Dr. Si GaulHu, MD    Felicia [name]   MRN: [id number]   DOB: Jan 27, 1956   Date of care:12/25/2016    Subjective:   Felicia [name] is a 61 year old female with past medical history of hypothyroidism, T2DM, and HTN here today for follow-up specifically to address her BP.       ?Episodes  - Feels warm all over and numb over left half of her body  - goes away after massage    HTN  - Did increase to 40mg  a day (1 tablet)  - Reports that last night she woke up at night and felt like she was really warm and thinks that it was 2/2 to high blood pressure  - Does not leave house so is unable to check at a [facility], and does not check at home because she has no cuff  - has been drinking seven blossums tea everynight    DM2  - No acute changes  - Taking Metformin 500 qday    Hypothyroid  - Doing very well since increasing to 150 last visit.   - Reports increased energy, and hair is growing back  - TSH from 10/10 came back wnl  - Denies hyperthyroid sx such as palpitations, heart racing, sweatinbg    Allergies: Patient has no known allergies.      Current Outpatient Medications:     aspirin 81 MG EC tablet, Take 1 tablet (81 mg) by mouth daily., Disp: 90 tablet, Rfl: 0    atorvastatin (LIPITOR) 40 MG tablet, Take 1 tablet (40 mg) by mouth daily., Disp: 90 tablet, Rfl: 0    hydrochlorothiazide (HYDRODIURIL) 25 MG tablet, Take 1 tablet (25 mg) by mouth daily., Disp: 90 tablet, Rfl: 0    ibuprofen (MOTRIN) 200 MG tablet, Take 2 tablets (400 mg) by mouth every 8 hours as needed for Mild Pain (Pain Score 1-3) or Moderate Pain (Pain Score 4-6). Take with food., Disp: 120 tablet, Rfl: 0    levothyroxine (SYNTHROID) 150 MCG tablet, Take 1 tablet (150 mcg) by mouth every morning (before breakfast)., Disp: 90 tablet, Rfl: 0    lisinopril (PRINIVIL, ZESTRIL) 40 MG tablet, Take 1 tablet (40 mg) by mouth daily., Disp: 90 tablet, Rfl: 0    metFORMIN (GLUCOPHAGE) 500 MG tablet, Take 1 tablet  (500 mg) by mouth daily., Disp: 90 tablet, Rfl: 0    ranitidine (ZANTAC) 150 MG tablet, Take 1 tablet (150 mg) by mouth daily as needed for Heartburn., Disp: 90 tablet, Rfl: 0      Objective:   Vitals:    01/08/17 1654   BP: 180/82   BP Location: Left arm   BP Patient Position: Sitting   BP cuff size: Regular   Pulse: 99   Resp: 18   Temp: 95.7 F (35.4 C)   TempSrc: Temporal   SpO2: 99%   Weight: 81.6 kg (180 lb)   BP Recheck at end of visit - 160/80      Date Weight Recorded 01/08/2017 12/25/2016 09/25/2016 07/03/2016 04/10/2016 10/18/2015   Metric 81.647 kg 80.74 kg 78.926 kg 83.008 kg 83.462 kg 82.101 kg   Pounds/Ounces 180 lb 178 lb 174 lb 183 lb 184 lb 181 lb          Physical Exam    01/08/17  1654   BP: 180/82   Pulse: 99   Resp: 18   Temp: 95.7 F (35.4 C)   SpO2: 99%  Pulse 101     Gen - well-appearing woman in no acute distress, well developed, very pleasant  HEENT - thyroid not palpable  Cardio - RRR, no m/r/g  Lungs - CTAB, good air movement througout  Extremities - moving all extremities equally, normal gait  Skin - Hair has new growth, no evidence of peeling or dry skin    Labs   Lab Results   Component Value Date    TSH 0.65 11/07/2016    TSH 29.94 (H) 08/28/2016    TSH 4.48 02/01/2015     Lab Results   Component Value Date    NA 138 11/07/2016    NA 137 02/01/2015    K 4.7 11/07/2016    K 4.2 02/01/2015    CL 103 11/07/2016    CL 102 02/01/2015    BICARB 22 11/07/2016    BICARB 26 02/01/2015    BUN 21 11/07/2016    BUN 25 02/01/2015    CREAT 0.67 11/07/2016    CREAT 0.80 02/01/2015    GLU 81 11/07/2016    GLU 109 (H) 02/01/2015    [place] 9.8 11/07/2016    [place] 9.7 02/01/2015       A1c (03/2016) 5.5    FOBT (06/2016): Negative  Pap (08/2016): Negative  Mammo (08/2016): Negative    HIV (10/2016)- negative  Hep C (10/2016)- negative    Assessment/Plan:   Felicia [name] is a 61 year old female with past medical history of hypothyroidism, T2DM, and HTN here today for follow-up.    #hypothyroidism - TSH now  wnl and she is symptotically much improved  - Continue levothyroxine 150  - Recheck TSH at next appointment    #. HTN - BP today was high, this is likely due to the decrease in lisinopril in combination with stopping metoprolol, however she is no longer having orthostasis  - Increase Lisinopril to 40mg  qday, repeat Cr at next blood draw  - Continue HCTZ 25mg   - 2 week BP check, consider increase dosage if still high  - [facility]ouraged [facility] when she can    #. DM2 - Last A1c 5.5 (03/2016), well controlled, no acute issues  - Continue metformin 500mg  qday  - Continue ASA, Atorvastatin  - recheck A1c at next appointment and if normal consider d/c metformin    #. GERD - stable  - Continue Ranitidine      # HCM  -Mammo 08/2016--> due 08/2017  -Pap 08/2016 negative --> due 08/2019  -FOBT 06/2016 negative --> due 06/2017  -Diabetic eye exam 08/2016 --> due 08/2018  -Flu shot 12/25/2016  -Prevnar 08/07/2014  -HIV/HepC neg 11/07/2016  -Needs Tdap    F/u:   - 2 week BP check: 01/08/2017  - 3 month med refill and f/u: 03/19/2017    To do at next visit:  - labs: TSH/A1c/CMP  - f/u BP    Patient seen and discussed with attending, Dr. Si GaulHu.    Darnelle Bosheresa Whitchurch, MS4

## 2017-01-08 NOTE — ED Notes (Signed)
EKG done and Handed to MD Xu.

## 2017-01-08 NOTE — Interdisciplinary (Signed)
Checked-in with Felicia [name]. She woke up having numbness on the left side of her body. She was going to go to the emergency room after our session. I asked if she's been experiencing any stressors at home, she denied any financial needs.    She did talk about a situation that happened with her family in August. Her son and her step-grandson got into an argument that has split the family. Her daughter married a man who had four sons from his past wife. Her daughter raised the four sons. The 4 grandsons grew up with her as a grandmother, they were respectful. Since the argument, her relationship was become strained with her son-in-law and 4 step-grandchildren. The second oldest was the one that got into an argument with her son. She believes he's been a trouble maker since a child; she stated he always likes to start problems. He moved out from her house and grew hate towards her and his grandpa. She thinks her grandson has manipulated her son-in-law to turn against her too. The second oldest was diagnosed with prostate cancer soon after the argument.     We talked about forgiveness and possibly visiting her grandson in the hospital. She is also considering talking to her son-in-law.    Felicia SellsMarlene Martinez: Social Work Intern    Approved by: Tobi BastosEsmeralda Preval, BSW

## 2017-01-08 NOTE — ED Notes (Signed)
Bed: 25  Expected date:   Expected time:   Means of arrival:   Comments:  Triage take

## 2017-01-08 NOTE — ED EKG Interpretation (Signed)
ED EKG Interpretation    Pt's EKG sinus 102bpm, no st elevations,qtc 450. qrs 78. no ectopy, non-diagnostic for acute ischemia, no priors for comparison.

## 2017-01-08 NOTE — Discharge Instructions (Signed)
    Dolor de pecho de origen poco claro        Chest Pain of Unclear Etiology      1.   Usted ha sido atendido por dolor en el pecho. Aún se desconoce la causa de su dolor.    1.   You have been seen for chest pain. The cause of your pain is not yet known.                    2.   Su médico ha revisado su historial médico, lo ha examinado a usted y ha evaluado las pruebas que se realizaron. Aún así, todavía no está claro por qué tiene dolor en el pecho. El médico piensa que sólo existe una pequeña probabilidad de que su dolor sea ocasionado por una condición que ponga en peligro su vida. Posteriormente, su médico familiar podrá realizar más exámenes o examinarlo de nuevo.     2.   Your doctor has learned about your medical history, examined you, and checked any tests that were done. Still, it is unclear why you are having pain. The doctor thinks there is only a very small chance that your pain is caused by a life-threatening condition. Later, your primary care doctor might do more tests or check you again.                    3.   A veces el dolor de pecho es ocasionado por una condición peligrosa, como un ataque al corazón, lesión de la aorta, coágulo en el pulmón o un pulmón colapsado. Es poco probable que su dolor sea ocasionado por una condición que ponga en peligro su vida si: su dolor de pecho dura sólo unos cuantos segundos a la vez; no le falta el aire, no siente náuseas (estómago revuelto), no suda mucho ni se siente aturdido; su dolor empeora al torcerse o girarse, su dolor disminuye con ejercicio o al realizar trabajos pesados.     3.   Sometimes chest pain is caused by a dangerous condition, like a heart attack, aorta injury, blood clot in the lung, or collapsed lung. It is unlikely that your pain is caused by a life-threatening condition if: Your chest pain lasts only a few seconds at a time; you are not short of breath, nauseated (sick to your stomach), sweaty, or lightheaded; your pain gets worse when  you twist or bend; your pain improves with exercise or hard work.                    4.   El dolor de pecho es serio. Es MUY IMPORTANTE que le dé seguimiento con su médico regular y busque atención médica inmediatamente aquí o en la Sal[facility] Emergencias más cercana si sus síntomas empeoran o cambian.    4.   Chest pain is serious. It is VERY IMPORTANT that you follow up with your regular doctor and seek medical attention immediately here or at the nearest Emergency Department if your symptoms become worse or they change.                    5.   DEBE BUSCAR ATENCIÓN MÉDICA INMEDIATA, AQUÍ O EN LA SALA DE EMERGENCIAS MÁS CERCANA, SI SE PRESENTA CUALQUIERA DE LAS SIGUIENTES SITUACIONES:    5.   YOU SHOULD SEEK MEDICAL ATTENTION IMMEDIATELY, EITHER HERE OR AT THE NEAREST EMERGENCY DEPARTMENT, IF ANY OF THE FOLLOWING OCCURS:        * Su dolor empeora.      * Your pain gets worse.        * Su dolor le provoca falta de aire, náuseas, o sudoración.       * Your pain makes you short of breath, nauseated, or sweaty.        *   Su dolor empeora cuando camina, sube escaleras o se esfuerza mucho.     * Your pain gets worse when you walk, go up stairs, or exert yourself.      * Se siente dbil, aturdido o se desmaya.    * You feel weak, lightheaded, or faint.      * Le duele al respirar.    * It hurts to breathe.      * Se le hincha una pierna.    * Your leg swells.      * Sus sntomas empeoran o tiene nuevos sntomas o molestias.     * Your symptoms get worse or you have new symptoms or concerns.                    Presin sangunea alta     Elevated Blood Pressure     1.  Durante su visita de hoy su presin arterial fue ms alta de lo normal.    1.  During your visit today your blood pressure was higher than normal.             2.  Verifique su presin sangunea varias veces durante los prximos das, y posteriormente dle seguimiento con su mdico familiar. Si no cuenta con un mdico, pdale  al personal mdico que le recomienden uno.   2.  Check your blood pressure several times over the next several days, then follow up with your regular doctor. If you do not have a doctor, ask the medical staff to refer you to one.             3.  Tal vez necesite medicamento para su presin sangunea si se mantiene alta. No tratar Neomia Dearuna presin sangunea alta puede daar su corazn y riones y causar un ataque al corazn o un derrame cerebral. Es MUY IMPORTANTE darle seguimiento con su mdico.   3.  You may need medication for your blood pressure if it stays high. Untreated high blood pressure can cause damage to your heart and kidneys and may lead to a heart attack or stroke. It is VERY IMPORTANT to follow up with your doctor.      * Verifique su presin sangunea diariamente y dele seguimiento con su mdico.    * Check your blood pressure daily and follow up with your doctor.      * Un mdico diagnosticar presin sangunea alta slo si su presin sangunea se ha mantenido alta por varios das. Muchas farmacias tienen aparatos en donde usted puede verificar su presin sangunea. Tambin puede acudir a la estacin de bomberos para ver si un paramdico le puede tomar su presin sangunea. Otra opcin es comprar un monitor de presin sangunea para uso domstico. Estn disponibles en la [facility]ora de las [place].     * A doctor will diagnose high blood pressure only if your blood pressure is high for several days. Many pharmacies have machines that let you check your own blood pressure. You can also check with a fire station to see whether a paramedic will take your blood pressure. Another option is to purchase a blood pressure monitor to use at home. These are available at most pharmacies.              4.  DEBE BUSCAR ATENCIN MDICA INMEDIATA, AQU O EN LA SALA DE EMERGENCIAS MS CERCANA, SI SE PRESENTA CUALQUIERA DE LAS SIGUIENTES SITUACIONES:   4.  YOU SHOULD SEEK MEDICAL ATTENTION  IMMEDIATELY, EITHER HERE  OR AT THE NEAREST EMERGENCY DEPARTMENT, IF ANY OF THE FOLLOWING OCCURS:      * Tiene dolor de cabeza repentino o fuerte.     * You have a sudden or severe headache.      * Siente entumecimiento, hormigueo o debilidad en la mitad de su cuerpo, se le descuelga la mitad de la cara o tiene dificultad para hablar.    * You are numb, tingly, or weak on one side of your body, half of your face droops, or you have trouble speaking.      * Tiene dolor de pecho.    * You have chest pain.      * Le falta el aire.    * You are short of breath.

## 2017-01-08 NOTE — ED Notes (Signed)
Pt reports she is not in a great deal of pain and would like to try a medication that is not as strong as morphine, MD notified

## 2017-01-08 NOTE — ED Notes (Addendum)
PRN NOTE  Xray at bedside

## 2017-01-08 NOTE — Progress Notes (Signed)
Medical Student Progress Note  Attending: Dr. Katrinka BlazingSmith, MD    Felicia [name]   MRN: [id number]   DOB: July 11, 1955   Date of care:01/08/2017      Subjective:   Felicia [name] is a 61 year old female with past medical history of hypothyroidism, T2DM, and HTN here today for follow-up specifically to address her BP.     Left sided numbness  - Reports that last night at 3am she woke up from sleep feeling very warm all over and with numbness/tingling in her left face, arm and leg (leg less then arm and face)  - She walked around a lot and massaged her thigh, which she thought helped a little, but it still experiencing these symptoms now, especially in her face, which she describes as "tight"  - She had an incident like this around 4 years ago and she went to the ER and was evaluated, her symptoms resolved in about a day, is unsure of any diagnosis that came from this incident.     HTN  - Did increase to 40mg  a day (1 tablet)  - Does not leave house so is unable to check [facility] a [facility], and does not check at home because she has no cuff  - has been drinking seven blossums tea every night (non-caffeinated)     DM2  - Did not discuss with patient today  - Taking Metformin 500 qday    Hypothyroid  - Reports no new symptoms, denies palpitations or sweating, but did have the episode of feeling warm last night    Allergies: Patient has no known allergies.      Current Outpatient Medications:     aspirin 81 MG EC tablet, Take 1 tablet (81 mg) by mouth daily., Disp: 90 tablet, Rfl: 0    atorvastatin (LIPITOR) 40 MG tablet, Take 1 tablet (40 mg) by mouth daily., Disp: 90 tablet, Rfl: 0    hydrochlorothiazide (HYDRODIURIL) 25 MG tablet, Take 1 tablet (25 mg) by mouth daily., Disp: 90 tablet, Rfl: 0    ibuprofen (MOTRIN) 200 MG tablet, Take 2 tablets (400 mg) by mouth every 8 hours as needed for Mild Pain (Pain Score 1-3) or Moderate Pain (Pain Score 4-6). Take with food., Disp: 120 tablet, Rfl: 0    levothyroxine  (SYNTHROID) 150 MCG tablet, Take 1 tablet (150 mcg) by mouth every morning (before breakfast)., Disp: 90 tablet, Rfl: 0    lisinopril (PRINIVIL, ZESTRIL) 40 MG tablet, Take 1 tablet (40 mg) by mouth daily., Disp: 90 tablet, Rfl: 0    metFORMIN (GLUCOPHAGE) 500 MG tablet, Take 1 tablet (500 mg) by mouth daily., Disp: 90 tablet, Rfl: 0    ranitidine (ZANTAC) 150 MG tablet, Take 1 tablet (150 mg) by mouth daily as needed for Heartburn., Disp: 90 tablet, Rfl: 0      Objective:     Date Weight Recorded 01/08/2017 12/25/2016 09/25/2016 07/03/2016 04/10/2016 10/18/2015   Metric 81.647 kg 80.74 kg 78.926 kg 83.008 kg 83.462 kg 82.101 kg   Pounds/Ounces 180 lb 178 lb 174 lb 183 lb 184 lb 181 lb          Physical Exam    01/08/17  1654   BP: 180/82   Pulse: 99   Resp: 18   Temp: 95.7 F (35.4 C)   SpO2: 99%       Gen - WDWN, tearful when talking about problems  Cardio - tachycardic 101, regluar, no m/r/g  Lungs - CTAB, good air  movement througout  Neuro -   CN 5 on the left has altered sensation in V1-V3 distributions, otherwise all CN intact  5/5 strength bilaterally in deltoid/tricep/bicep//wrist extensors/wrist flexors/hand grip  5/5 strength bilaterally in hip flexor/quad/hamstring/TA/Gastroc/EHL/FHL  Reflexes 1+ in achilles, patellar, biceps, triceps, BR  Negative babinski  Finger to nose, rapid hand movements, and heel to shin normal    Labs   No new labs    Assessment/Plan:   Felicia [name] is a 61 year old female with past medical history of hypothyroidism, T2DM, and HTN presenting today with new onset left sided numbness.     #. Left sided numbness - ddx stroke vs TIA, patient has risk factors including HTN, DM, and HLD and presenting with sudden onset left sided numbness that has persisted for approx. 17 hours. Recommended to patient that she should receive brain imaging and arranged for [facility], her main concern is the cost as she does not have any insurance, including  emergency medi-cal.  [facility] emergency room referral   - Patient to apply for emergency medi-cal afterwards, the [facility] has persons to sign up for insurance on certain days, need to follow-up after ED visit    #hypothyroidism - TSH on 11/07/2016 wnl, however patient has persistently increased systolic BP and now has tachycardia to the low 100s, concern that may have increased BP/HR 2/2 hyperthyroidism  - TSH/T4 lab at next visit    #. HTN - BP was 180/82 today, concern for increased BP 2/2 hyper thyroid vs. Uncontrolled essential hypertension. If TSH/T4 wnl consider increasing BP meds  - continue lisinopril 40mg   - Continue HCTZ 25mg     #. DM2 - Last A1c 5.5 (03/2016), well controlled  - Continue metformin 500mg  qday  - Continue ASA, Atorvastatin  - recheck A1c at next appointment and if normal consider d/c metformin    #. GERD - stable  - Continue Ranitidine      # HCM  -Mammo 08/2016--> due 08/2017  -Pap 08/2016 negative --> due 08/2019  -FOBT 06/2016 negative --> due 06/2017  -Diabetic eye exam 08/2016 --> due 08/2018  -Flu shot 12/25/2016  -Prevnar 08/07/2014  -HIV/HepC neg 11/07/2016  -Needs Tdap    F/u:   - does not have ED follow-up visit scheduled yet  - 3 month med refill and f/u: 03/19/2017    To do at next visit:  - f/u ED visit  - labs: TSH/A1c/CMP  - f/u BP    Patient seen and discussed with attending, Dr. Katrinka BlazingSmith.     Darnelle Bosheresa Whitchurch, MS4

## 2017-01-08 NOTE — ED Provider Notes (Signed)
Emergency Department Note   electronic medical record reviewed for pertinent medical history.     Patient: Felicia [name], MRN [id number], DOB 10/22/1957  The Date of Service for the Emergency Room encounter is 01/08/2017  9:06 PM     Nursing Triage Note :   Chief Complaint   Patient presents with    Hypertension     referred from clinic for high blood pressure, was not told the value, numbness in the left arm this afternoon, pain in the left shoulder pain 8/10, took her medications,        HPI :   Felicia HornMaria Moreno Rodd is a 61 year old female with PMHx hypertension, hyperlipidemia, diabetes, bladder retention, who presents from PCP clinic with hypertension, chest pain, and left arm numbness.  Patient states that she woke up at 3:00 a.m. with acute onset left-sided substernal chest pain associated with left arm numbness, left shoulder pain, left facial numbness and a sensation of feeling warm. Chest pain has increased throughout the day from a 2/10 to a 9/10 now, is nonexertional, but is pleuritic and worse with palpation of her chest, associated with lightheadedness, nausea.  Denies chest or head trauma. Denies shortness of breath. Patient states that she went to a previously scheduled clinic appointment tonight to obtain a 2nd reading of elevated blood pressure after an initial reading of elevated blood pressure 2 weeks ago and was told to come into the ED tonight given chest pain and arm numbness.  Patient takes captopril, aspirin, metformin, statin, and a medication for her bladder retention".  Denies prior history of chest pain.  Denies family history of cardiovascular disease or sudden cardiac death.  Denies prior history of CVA.  Denies slurred speech, confusion, difficulty walking.  Denies history of blood clot, recent hospitalizations or immobilizations, is not on hormone replacement therapy.  Denies SOB/ DOE/ PND/ orthopnea/ LE swelling/ cough/ anxiety/ unexplained weight gain or loss. Denies F/ N/  V/ D/ diaphoresis/ chills/ congestion/ sore throat/ HA/ neck pain or stiffness/ photophobia/ blurry or double vision/ hematochezia/ melena/ dysuria/ hematuria    Past Medical History : No past medical history on file.    Past Surgical history : No past surgical history on file.    Family History: No family history on file.    Social History:  Social History     Tobacco Use    Smoking status: Not on file   Substance Use Topics    Alcohol use: Not on file    Drug use: Not on file     Medications:   None       Allergies: Patient has no known allergies.    Review of Systems:  Complete review of 12 systems reviewed and negative unless otherwise noted in the HPI or above. This was done per my custom and practice for systems appropriate to the chief complaint in an emergency department setting and varies depending on the quality of history that the patient is able to provide. History reviewed today as available from records and EPIC chart.      Physical Exam:   01/08/17  2100 01/08/17  2140 01/08/17  2300   BP: 160/75  131/54   Pulse: (!) 116  98   Resp: 18  20   Temp: 98.1 F (36.7 C)     SpO2: 100% 100% 100%       Nursing note and vitals reviewed. Pt not hypoxic.  Gen: Patient is in NAD, non-toxic appearing, cooperative  HEENT:  NC/AT, PERRL, EOMI, MMM, no conjunctival injection, b/l sclera anicteric, oropharynx clear, no LAD  Neck: Supple. No meningeal signs.   Cardiovascular: RRR no m/r/g, normal heart sounds, no JVD, No carotid bruits  Pulmonary/Chest: CTAB, no increased WOB, no respiratory distress, no wheezes/rhonchi/rales, chest wall tenderness.   Abdominal: Soft. NT/ND, +BS, no r/g, no masses   Extr/MSK: Well perfused, distal pulses intact. No tenderness. FROM. No LE edema. Sensation slightly reduced in left upper extremity and left lower extremity.  Back: No CVAT   Neuro: No evidence of facial droop, normal speech, mentation appropriate, steady gait.  Reduced sensation on left side of face in V1-V3  distributions. Cranial nerves II-XII otherwise intact. No dysmetria on finger-to-nose or heel-to-shin.  Rapid alternating movements intact. No pronator drift.  Psychiatric: Normal affect. Mood not labile nor depressed.   Skin: No rashes, lesions, or wounds.     ED Course & Clinical Decision Making:  61 year old female with PMHx hypertension, hyperlipidemia, diabetes, bladder retention, who presents from PCP clinic with hypertension, chest pain, and left arm numbness.      Vitals: wnl  Pertinent Exam Findings:  Well-appearing female, chest wall tenderness, sensation slightly reduced in left upper extremity, left lower extremity, and left side of face.  Otherwise neurologically intact.    DDx broad at this time, but cannot rule out ACS as cause of chest pain radiating to left upper extremity, associated with lightheadedness. Differential also includes PNA vs chest wall pain/costochondritis vs rib fracture vs GERD-related discomfort vs non-specific CP; Very low clinical suspicion to suggest PE (no obvious risk factors or recent travel/surgeries, Wells criteria low risk and PERC negative, therefore will defer D-dimer and CT PE imaging), aortic dissection, spontaneous pneumothorax, Boerhave's syndrome, or acute tamponade.  Left facial, LUE and LLE numbness is concerning for CVA versus TIA, however absence of other neurologic symptoms make stroke unlikely.  Additionally given patient woke up with her symptoms, last known normal would of been greater than 24 hours prior to presentation today so patient will be outside of the tPA window.    Initial workup to include CXR, EKG, troponin and basic labs. Repeat cardiac enzymes in 1-2hrs for trend with repeat serial EKGs c/f dynamic changes.  Patient placed on cardiac monitoring and titrate pulse oximetry >92%.  Will additionally order CT head given paresthesias with unknown source and risk factors for CVA.    Will continue to reassess patient while in the ER and adjust workup  based on initial labs and imaging. Symptomatic control as below including analgesics. Disposition pending workup.     Orders Placed This Encounter   Procedures    X-Ray Chest Single View    CT Head W/O Contrast    Comprehensive Metabolic Panel Green    HIV 1/2 Antibody & P24 Antigen Assay Yellow serum separator tube    Troponin T Gen 5 w/Reflex to CK/CKMB Green Plasma Separator Tube    CBC w/ Diff Lavender    Pro Bnp, Blood Green Plasma Separator Tube    Troponin T Gen 5 w/Reflex to CK/CKMB    ECG 12 Lead     Medications   aspirin chewable tablet 324 mg (324 mg Oral Given 01/08/17 2200)   acetaminophen (TYLENOL) tablet 975 mg (975 mg Oral Given 01/08/17 2235)       Diagnostic Imaging Studies  Ct Head W/o Contrast    Result Date: 01/08/2017  EXAM DESCRIPTION: CT HEAD W/O CONTRAST:01/08/2017 10:37 pm CLINICAL HISTORY: Left arm numbness and chest  pain starting at 0300 hours. TECHNIQUE: A CT scan of the head was performed from the foramen magnum to the skull vertex without the use of intravenous contrast. Axial, coronal and sagittal reconstructions were provided. Up-to-date CT equipment and radiation dose reduction techniques were employed. CTDIvol: 63.0 mGy. DLP: 1056 mGy-cm. COMPARISON: None available FINDINGS: The ventricles and sulci are normal for age.  No mass-effect or midline shift is present, and the basal cisterns are patent. There is no evidence of intracranial hemorrhage or mass lesion. The soft tissues of the scalp are normal.  The calvarium and skull base are normal without evidence of fracture or other abnormality. The visualized paranasal sinuses and mastoid air cells are clear. CONCURRENT SUPERVISION: I have reviewed the images and agree with the Resident's interpretation. DOSE STATEMENT: [facility] CT scanners employ modern techniques for CT dose reduction, including protocol review, automatic exposure control, and iterative reconstruction techniques.  These features assure  that radiation dose levels in CT are optimized and are consistent with state-of-the-art, low dose CT practice.    Signed by: Samella Parr 01/08/2017 22:45:55    IMPRESSION: No acute intracranial findings.     X-ray Chest Single View    Result Date: 01/08/2017  EXAM DESCRIPTION: X-RAY CHEST SINGLE VIEW CLINICAL HISTORY: Chest pain COMPARISON: None available. FINDINGS: Lines and Tubes: None Mediastinum: The cardiomediastinal silhouette is unchanged. No lymphadenopathy is appreciated. Lungs: The lungs are clear. Pleura: No pneumothorax or effusion. Bones and soft tissues: Unchanged    Signed by: Samella Parr 01/08/2017 22:19:37    IMPRESSION: No acute cardiopulmonary process.      Workup Review as of Jan 10 55   Ace Gins [facility] Documentation   Wed Jan 08, 2017   2321 Tachycardia resolved Heart Rate: 98   2321 HTN resolved Blood pressure (BP): 131/54   2304 CT head - IMPRESSION:  No acute intracranial findings.    2246 CXR - IMPRESSION:  No acute cardiopulmonary process.    2246 Mild leukocytosis WBC: (!) 11.0   2111 Heart Rate: (!) 116   2111 Blood pressure (BP): 160/75       I reevaluated the patient. Chest pain and left facial numbness resolved status post aspirin and acetaminophen. The patient was discharged home, status improved, with instructions regarding supportive care, medications, and appropriate follow up. I discussed indications for seeking immediate medical attention, and the patient voiced understanding. The patient was discharged home in stable condition after all questions were answered.    I have discussed my thought process and plan for the patient with the attending physician, Dr. Jeannine Kitten.    Ace Gins, MD   Emergency Medicine Resident, PGY1       Larina Bras, Barbette Or, MD  Resident  01/09/17 0056       Jay Schlichter, MD  01/10/17 [phone]

## 2017-01-08 NOTE — ED MD Progress Note (Signed)
EKG Interpretation  Rate: 102  Rhythm: Sinus, regular  Other findings: No e/o ischemia, normal intervals

## 2017-01-09 LAB — HIV 1/2 ANTIBODY & P24 ANTIGEN ASSAY, BLOOD: HIV 1/2 Antibody & P24 Antigen Assay: NONREACTIVE

## 2017-01-09 LAB — HCV ANTIBODY WITH REFLEX QUANT: Hepatitis C Ab: NONREACTIVE

## 2017-01-10 ENCOUNTER — Telehealth (HOSPITAL_BASED_OUTPATIENT_CLINIC_OR_DEPARTMENT_OTHER): Payer: Self-pay

## 2017-01-10 NOTE — Telephone Encounter (Signed)
Transitional Telephonic Nurse (TTN)   Patient unreached via post discharge calls.     01/10/17@10 :21am TTN attempted to reach patient at [phone]. Left message with TTN's contact number on voicemail.       Lollie SailsGwee Zekiel Torian  Transitional Telephonic Nurse  [facility]  [phone]  [email or web address]

## 2017-01-11 LAB — ECG 12-LEAD
ATRIAL RATE: 102 {beats}/min
P AXIS: 45 degrees
PR INTERVAL: 152 ms
QRS INTERVAL/DURATION: 78 ms
QT: 346 ms
QTC INTERVAL: 450 ms
R AXIS: 47 degrees
T AXIS: 70 degrees
VENTRICULAR RATE: 102 {beats}/min

## 2017-01-15 ENCOUNTER — Ambulatory Visit: Payer: Self-pay | Admitting: Family Practice

## 2017-01-15 VITALS — BP 180/75

## 2017-01-15 DIAGNOSIS — I10 Essential (primary) hypertension: Principal | ICD-10-CM

## 2017-01-15 MED ORDER — AMLODIPINE 10 MG OR TABS
10.0000 mg | ORAL_TABLET | Freq: Every day | ORAL | 0 refills | Status: DC
Start: 2017-01-15 — End: 2017-03-19

## 2017-01-15 NOTE — Progress Notes (Signed)
[facility] Progress Note    Student(s): Imogene BurnVictoria Nakayla Rorabaugh MS2    Attending: Dr. Rebeca AlertSunny Smith      SUBJECTIVE:    Felicia [name] is a 61 year old female with a history of HTN, DM, and hypothyroidism who presented today for BP check following ED visit 01/08/17 for left sided numbness/tingling in face, arm, and leg.  Last week patient was referred to the ED from clinic because of symptoms of left sided numbness/tingling in her left face, arm, and leg. At Emergency Room visit they opened a new chart for patient - ED visit workup unremarkable. Arrived to ED with BP 160/75, discharged at 131/54 without any interventions. CT head negative, CXR without acute disease, BNPP wnl, troponin T wnl, EKG showed sinus tachycardia without other abnormalities. Chest pain and left sided numbness resolved with aspirin and acetaminophen. Patient was instructed to follow-up with outpatient clinic to check BP and further titrate medications.  Today patient still experiencing some warmth and the left side of her face and chest, but it has improved markedly since a week ago. She specifically denies chest pain, palpitations, headache, changes in vision.       Current Outpatient Medications   Medication Sig    amLODIPINE (NORVASC) 10 MG tablet Take 1 tablet (10 mg) by mouth daily.    aspirin 81 MG EC tablet Take 1 tablet (81 mg) by mouth daily.    atorvastatin (LIPITOR) 40 MG tablet Take 1 tablet (40 mg) by mouth daily.    hydrochlorothiazide (HYDRODIURIL) 25 MG tablet Take 1 tablet (25 mg) by mouth daily.    ibuprofen (MOTRIN) 200 MG tablet Take 2 tablets (400 mg) by mouth every 8 hours as needed for Mild Pain (Pain Score 1-3) or Moderate Pain (Pain Score 4-6). Take with food.    levothyroxine (SYNTHROID) 150 MCG tablet Take 1 tablet (150 mcg) by mouth every morning (before breakfast).    lisinopril (PRINIVIL, ZESTRIL) 40 MG tablet Take 1 tablet (40 mg) by mouth daily.    metFORMIN (GLUCOPHAGE) 500 MG tablet Take 1  tablet (500 mg) by mouth daily.    ranitidine (ZANTAC) 150 MG tablet Take 1 tablet (150 mg) by mouth daily as needed for Heartburn.     No current facility-administered medications for this visit.        Review of Systems: as per HPI    OBJECTIVE:  BP 180/75   Gen: well appearing, NAD  CV: rrr, no m/r/g, no carotid bruits    Labs 01/08/17 - BNP and troponin wnl    EKG 01/08/17 - sinus tachycardia    CT Head 01/08/17 - no abnormalities     CXR 01/08/17 - no acute cardiopulmonary disease    Assessment and Plan: Felicia [name] is a 61 year old female with a history of HTN, DM, and hypothyroidism who presented today for BP check following ED visit 01/08/17 for left sided numbness/tingling in face, arm, and leg to r/o stroke (Her ED note is under a different MRN)    #left sided warmth/tingling: went to ED and ruled out stroke Symptoms of warmth and tingling in face and chest improved from last week.    continue to monitor   ED precautions given if sx worsen    #HTN: Blood pressure today 180/75 with two separate readings without any symptoms of hypertensive crisis. Currently taking lisinopril 40mg  and HCTZ 25mg . Clinically stable, but needs additional blood pressure control. Etiology of isolated systolic BP elevation unknown -  considering essential HTN vs overtitration of levothyroxine. Pt was seen near close of clinic after lab no longer available.  Given elevations in pulse and systolic blood pressure following the dose increase of levothyroxine to 150, may consider repeating TSH/T4 at next visit to rule out as cause of systolic HTN.   Last TSH wnl at 0.64 in October.  Levothyroxine increase 100 --> 125 07/10/16 (TSH 72.62) -   Levothyroxine increased 125 --> 150 09/25/16 (TSH 29.94)  BP 07/03/16 130/85, BP 09/25/16 98/56, BP 12/25/16 151/69, BP 01/08/17 180/82, BP 01/15/17 180/75  Pulse trending up from 80 on 07/03/16 and 62 on 09/25/16 to 98 on 12/25/16 and 99 on 01/08/17.      add amlodipine 10mg  QD today   recheck  BP at next clinic on 02/06/16   Encouraged the patient to try to take BP at home or pharmacy and keep a log to bring to next appointment   Strict ED precautions given.     #Emergency medical: Patient concerned about ED visit bills and nervous to go to the ED in the future because she does not currently have Emergency MediCal coverage and is worried about medical bill costs.    Spoke with Vladimir Croftssmeralda today about applying for Emergency MediCal coverage    Patient to RTC on: 02/05/17    To do items at next visit:   BP check, adjust medications in needed   Consider TSH/T4 lab   Follow-up chart merge from ED visit 01/08/17      Patient seen, discussed and examined with Dr. Rebeca AlertSunny Smith

## 2017-02-05 ENCOUNTER — Other Ambulatory Visit: Payer: Self-pay | Admitting: Family Practice

## 2017-02-05 ENCOUNTER — Ambulatory Visit: Payer: Self-pay | Admitting: Internal Medicine

## 2017-02-05 VITALS — BP 130/60 | HR 98 | Temp 97.7°F | Resp 18 | Wt 185.0 lb

## 2017-02-05 DIAGNOSIS — Z23 Encounter for immunization: Secondary | ICD-10-CM

## 2017-02-05 DIAGNOSIS — E119 Type 2 diabetes mellitus without complications: Secondary | ICD-10-CM

## 2017-02-05 DIAGNOSIS — E039 Hypothyroidism, unspecified: Secondary | ICD-10-CM

## 2017-02-05 DIAGNOSIS — I10 Essential (primary) hypertension: Principal | ICD-10-CM

## 2017-02-05 NOTE — Progress Notes (Incomplete)
[facility] Progress Note    Student(s): [name], MS4  Terrilyn Saver, [place]    Attending: Dr. Kimberlee Nearing      SUBJECTIVE:    Felicia [name] is a 62 year old female with   Chief Complaint   Patient presents with    Follow-up Hypertension     Pt reports feeling well since her visit to the ED on 01/08/17 for high BP (160/75) and numbness/tingling/warmth of L face, neck, arm, and leg. Pt still has some residual tingling of the L side of her face, and her arm and leg occasionally feel warm and itchy. When that happens she takes removes her clothes, which helps her extremities cool down. Denies any weakness or pain. Never had any issues with speech, vision, or hearing during or after the episode. No further episodes similar to what she experienced on 12/12. Pt is glad her BP was better today, though she hasn't been able to check it on her own since she doesn't have a machine at home, and all stores/pharmacies are far from her place.     Pt has has good energy, sleep, and appetite lately. Says she had lost about 10 lbs previously, and her metoprolol was stopped as a result. Since then, she feels like she has regained some weight, though she makes an effort to not eat as much. Denies any palpitations, sweating, HAs. Says her HR went up during last episode because she was very anxious, but she now feels much calmer. Says her labs were all checked when she was in the ED, and all came back wnl. Brought in the paperwork last time she was in clinic. Pt continues to worry about medical coverage of her ED fees and was told she could talk to social work or legal this visit.    Pt is taking meds as prescribed, though she rarely takes any ibuprofen since she hasn't been in much pain, and has not needed the ranitidine either.     Current Outpatient Medications   Medication Sig    amLODIPINE (NORVASC) 10 MG tablet Take 1 tablet (10 mg) by mouth daily.    aspirin 81 MG EC tablet Take 1 tablet (81 mg) by  mouth daily.    atorvastatin (LIPITOR) 40 MG tablet Take 1 tablet (40 mg) by mouth daily.    hydrochlorothiazide (HYDRODIURIL) 25 MG tablet Take 1 tablet (25 mg) by mouth daily.    ibuprofen (MOTRIN) 200 MG tablet Take 2 tablets (400 mg) by mouth every 8 hours as needed for Mild Pain (Pain Score 1-3) or Moderate Pain (Pain Score 4-6). Take with food.    levothyroxine (SYNTHROID) 150 MCG tablet Take 1 tablet (150 mcg) by mouth every morning (before breakfast).    lisinopril (PRINIVIL, ZESTRIL) 40 MG tablet Take 1 tablet (40 mg) by mouth daily.    metFORMIN (GLUCOPHAGE) 500 MG tablet Take 1 tablet (500 mg) by mouth daily.    ranitidine (ZANTAC) 150 MG tablet Take 1 tablet (150 mg) by mouth daily as needed for Heartburn.     No current facility-administered medications for this visit.        Review of Systems: as per HPI, otherwise negative    OBJECTIVE:  BP 130/60 (BP Location: Right arm, BP Patient Position: Sitting)    Pulse 98    Temp 97.7 F (36.5 C)    Resp 18    Wt 83.9 kg (185 lb)    SpO2 98%   Gen: pleasant, overweight  hispanic woman, in NAD  Lungs: CTAB no w/r/r  CV: RRR no m/r/g  Abd: NTND, +BS  Ext: no c/c/e  Neuro: slight L facial droop CNs otherwise intact (including V), strength was 5/5 throughout with no pronator drift, sensation intact to light touch throughout, RAM and finger to nose intact, romberg negative, normal gait    Labs:    [facility] 07/03/2016 07/19/2015 04/26/2015 02/01/2015 08/03/2014 05/11/2014 05/11/2014   PHQ2 Total 0 0 0 0 0 2 2         Assessment and Plan:  Judeth HornMaria Moreno [name] is a 62 year old female with a hx of HTN, DM2, and hypothyroidism here to f/u on her elevated BP.    HTN: BP 130/60 today, pt has been asymptomatic since her ED visit on 12/12. Tolerating amlodipine well, in addition to being compliant with her lisinopril and HCTZ. Discussed the possibility of getting a BP monitor at home and offered suggestions for low cost options. Pt agreeable to keep a log and bring it in at  next visit.  -Continue amlodipine 10 mg qday  -Continue lisinopril 40 mg qday  -Continue HCTZ 25 mg qday    DM2: last A1c 5.5 in 03/2016 and trending down, pt compliant with metformin. Pt had lost 10 lbs with lifestyle mgmt but recently regained the weight, so will get another A1c. No diabetic retinopathy per ophtho in 08/2016, recheck in 2 years.  -Continue metformin 500 mg qday  -Recheck A1c    Hypothyroidism: currently asymptomatic and last TSH was 0.65 in 10/2016 after being very elevated earlier in the year and increasing her synthroid as a result. Pt had some hair loss at the time but no longer endorses any.  -Continue levothyroxine 150 mcg qday  -Consider rechecking TSH/T4 next visit, or if sx resume    Residual L face tingling, L arm/leg warmth and itchiness: pt could have had a small stroke on 12/12, but residual sx minimal and intermittent, while current neuro exam is reassuring. Managing risk factors including BP, DM2 as above, and XOL (lipids last checked 06/2015, due for recheck).  -Continue ASA 81mg  qday  -Continue atorvastatin 40 mg qday  -Recheck lipid panel    Health Care Maintenance:  Contraception: postmenopausal  Immunizations: flu shot 12/25/16, needs Tdap  Cancer Screening: mammogram due 08/2017, Pap due 08/2019, FOBT due 06/2017    Patient to RTC on: 03/19/17     To do items at next visit:  -refill meds  -f/u A1c, lipid panel, BP log    Patient seen, discussed and examined with Dr. Kimberlee NearingBillie Green.    Margreta Journeyavid Adamowicz, MS4

## 2017-02-05 NOTE — Progress Notes (Signed)
[facility] Progress Note    Student(s): Margreta Journeyavid Maggie Senseney, MS4  Terrilyn Saverhristie Lee, [place]    Attending:       SUBJECTIVE:    Felicia [name] is a 62 year old female with   Chief Complaint   Patient presents with    Follow-up Hypertension     Pt reports feeling well since her visit to the ED on 01/08/17 for high BP (160/75) and numbness/tingling/warmth of L face, neck, arm, and leg. Pt still has some residual tingling of the L side of her face, and her arm and leg occasionally feel warm and itchy. When that happens she takes removes her clothes, which helps her extremities cool down. Denies any weakness or pain. No further episodes similar to what she experienced on 12/12. Pt is glad her BP was better today, though she hasn't been able to check it on her own since she doesn't have a machine at home, and all stores/pharmacies are far from her place.     Pt has has good energy, sleep, and appetite lately. Says she had lost about 10 lbs previously, and her metoprolol was stopped was stopped as a result. Since then, she feels like she has regained some weight, though she makes an effort to not eat as much. Denies any palpitations, sweating, HAs. Says her HR went up during last episode because she was very anxious, but she now feels much calmer. Says her labs were all checked when she was in the ED.    Current Outpatient Medications   Medication Sig    amLODIPINE (NORVASC) 10 MG tablet Take 1 tablet (10 mg) by mouth daily.    aspirin 81 MG EC tablet Take 1 tablet (81 mg) by mouth daily.    atorvastatin (LIPITOR) 40 MG tablet Take 1 tablet (40 mg) by mouth daily.    hydrochlorothiazide (HYDRODIURIL) 25 MG tablet Take 1 tablet (25 mg) by mouth daily.    ibuprofen (MOTRIN) 200 MG tablet Take 2 tablets (400 mg) by mouth every 8 hours as needed for Mild Pain (Pain Score 1-3) or Moderate Pain (Pain Score 4-6). Take with food.    levothyroxine (SYNTHROID) 150 MCG tablet Take 1 tablet (150 mcg) by mouth every  morning (before breakfast).    lisinopril (PRINIVIL, ZESTRIL) 40 MG tablet Take 1 tablet (40 mg) by mouth daily.    metFORMIN (GLUCOPHAGE) 500 MG tablet Take 1 tablet (500 mg) by mouth daily.    ranitidine (ZANTAC) 150 MG tablet Take 1 tablet (150 mg) by mouth daily as needed for Heartburn.     No current facility-administered medications for this visit.        Review of Systems:    OBJECTIVE:  BP 130/60 (BP Location: Right arm, BP Patient Position: Sitting)    Pulse 98    Temp 97.7 F (36.5 C)    Resp 18    Wt 83.9 kg (185 lb)    SpO2 98%   Gen:  Lungs:   CV:   Abd:   Ext:     Labs:    FM PHQ2 07/03/2016 07/19/2015 04/26/2015 02/01/2015 08/03/2014 05/11/2014 05/11/2014   PHQ2 Total 0 0 0 0 0 2 2         Assessment and Plan:    Health Care Maintenance:  Contraception:  Immunizations:  Cancer Screening:    Patient to RTC on:    To do items at next visit:      Patient seen, discussed and examined with ***

## 2017-02-07 LAB — NON HDL CHOLESTEROL -QUEST: Non-HDL Cholesterol: 130 mg/dL (calc) — ABNORMAL HIGH (ref <130)

## 2017-02-07 LAB — CHOLESTEROL/HDLC RATIO-QUEST: Chol/HDLC Ratio: 3.2 (calc) (ref <5.0)

## 2017-02-07 LAB — LDL-CHOLESTEROL (CALC)-QUEST: LDL-Cholesterol: 110 mg/dL (calc) — ABNORMAL HIGH

## 2017-02-07 LAB — TRIGLYCERIDES, BLOOD: Triglycerides: 102 mg/dL (ref <150)

## 2017-02-07 LAB — HDL-CHOLESTEROL, BLOOD: HDL Cholesterol: 60 mg/dL (ref >50)

## 2017-02-07 LAB — CHOLESTEROL, TOTAL BLOOD: Cholesterol: 190 mg/dL (ref <200)

## 2017-02-07 LAB — GLYCOSYLATED HGB(A1C), BLOOD: Hgb A1C: 6 % of total Hgb — ABNORMAL HIGH (ref <5.7)

## 2017-02-12 NOTE — Progress Notes (Signed)
Pt seen and examined with medical students, agree with assessment and plan

## 2017-03-19 ENCOUNTER — Ambulatory Visit: Payer: Self-pay | Admitting: Family Practice

## 2017-03-19 DIAGNOSIS — I10 Essential (primary) hypertension: Principal | ICD-10-CM

## 2017-03-19 DIAGNOSIS — M545 Low back pain, unspecified: Secondary | ICD-10-CM

## 2017-03-19 DIAGNOSIS — K219 Gastro-esophageal reflux disease without esophagitis: Secondary | ICD-10-CM

## 2017-03-19 DIAGNOSIS — E785 Hyperlipidemia, unspecified: Secondary | ICD-10-CM

## 2017-03-19 DIAGNOSIS — E039 Hypothyroidism, unspecified: Secondary | ICD-10-CM

## 2017-03-19 DIAGNOSIS — E1169 Type 2 diabetes mellitus with other specified complication: Secondary | ICD-10-CM

## 2017-03-19 DIAGNOSIS — E119 Type 2 diabetes mellitus without complications: Secondary | ICD-10-CM

## 2017-03-19 MED ORDER — AMLODIPINE 10 MG OR TABS
10.0000 mg | ORAL_TABLET | Freq: Every day | ORAL | 0 refills | Status: DC
Start: 2017-03-19 — End: 2017-05-28

## 2017-03-19 MED ORDER — LISINOPRIL 40 MG OR TABS
40.0000 mg | ORAL_TABLET | Freq: Every day | ORAL | 0 refills | Status: DC
Start: 2017-03-19 — End: 2017-05-28

## 2017-03-19 MED ORDER — ATORVASTATIN CALCIUM 40 MG OR TABS
40.0000 mg | ORAL_TABLET | Freq: Every day | ORAL | 0 refills | Status: DC
Start: 2017-03-19 — End: 2017-05-28

## 2017-03-19 MED ORDER — METFORMIN HCL 500 MG OR TABS
500.0000 mg | ORAL_TABLET | Freq: Every day | ORAL | 0 refills | Status: DC
Start: 2017-03-19 — End: 2017-05-28

## 2017-03-19 MED ORDER — IBUPROFEN 200 MG OR TABS
400.0000 mg | ORAL_TABLET | Freq: Three times a day (TID) | ORAL | 0 refills | Status: DC | PRN
Start: 2017-03-19 — End: 2017-05-28

## 2017-03-19 MED ORDER — RANITIDINE HCL 150 MG OR TABS
150.0000 mg | ORAL_TABLET | Freq: Every day | ORAL | 0 refills | Status: DC | PRN
Start: 2017-03-19 — End: 2017-05-28

## 2017-03-19 MED ORDER — LEVOTHYROXINE SODIUM 150 MCG OR TABS
150.0000 ug | ORAL_TABLET | Freq: Every day | ORAL | 0 refills | Status: DC
Start: 2017-03-19 — End: 2017-05-28

## 2017-03-19 MED ORDER — ASPIRIN EC 81 MG OR TBEC (CUSTOM)
81.0000 mg | DELAYED_RELEASE_TABLET | Freq: Every day | ORAL | 0 refills | Status: DC
Start: 2017-03-19 — End: 2017-05-28

## 2017-03-19 MED ORDER — HYDROCHLOROTHIAZIDE 25 MG OR TABS
25.0000 mg | ORAL_TABLET | Freq: Every day | ORAL | 0 refills | Status: DC
Start: 2017-03-19 — End: 2017-05-28

## 2017-03-19 NOTE — Interdisciplinary (Signed)
Felicia [name] has been receiving expensive bills from the hospital. She does not have enough money to pay the expenses. I will call the [facility] to see if they can do [facility].    Her husband only makes enough money to pay for bills (rent, electricity, etc). Felicia [name] went to the food pantry I suggested to her the last time, she said they gave a good amount of food. She did not go last month because she had visitors over.     Felicia [name] hasn't worked in a month. 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 She may be out of work for good. Felicia [name] wants to continue working because it will keep her mind off her health.    Felicia [name] prefers to spend time outside the home. The other day, she cashed out on some plastic bottle her family was collecting.     Felicia [name]'s step-grandson cancer spread. He had to get a surgery to remove the cancer. He is still very angry with his step-mom and step-grandparents. We talked about forgiveness again, Felicia [name] mentioned she finds peace at church. She talked about a retreat from church that she enjoyed, she's looking forward for the next retreat.    Felicia SellsMarlene [name]: Social Work Intern  Approved by: Tobi BastosEsmeralda [name], BSW

## 2017-03-23 ENCOUNTER — Encounter: Payer: Self-pay | Admitting: Family Practice

## 2017-03-23 NOTE — Progress Notes (Signed)
[facility] Progress Note    Student(s): Felicia [name], Felicia [name]    Attending: Dr. Rebeca AlertSunny [name]    SUBJECTIVE:    Felicia [name] is a 62 year old female with DM, HTN, HLD, and thyroid abnormalities is here presenting for follow up and medication refill.      Today she is doing well. No new complaints. This is her second clinic visit since an ED visit (01/08/17 for complaint of left sided numbness/tingling. Negative work cardiac and stroke work up. Symps have since resolved.    Hypothyroid   No symptoms since increasing levothyroxine to 150  Reports increase energy, hair growth, clear moist skin.   Has occasional palpitations when taking meds, denies heart racing, sweating, tremors, swelling.     HTN   On Amlodipine, HTCZ, and lisinopril.   Denies headache, weakness, chest pain, or shortness of breath.     DM2  -no acute changes.   -taking Metformin 500 qd, ASA, atorvastatin       Current Outpatient Medications   Medication Sig    amLODIPINE (NORVASC) 10 MG tablet Take 1 tablet (10 mg) by mouth daily.    aspirin 81 MG EC tablet Take 1 tablet (81 mg) by mouth daily.    atorvastatin (LIPITOR) 40 MG tablet Take 1 tablet (40 mg) by mouth daily.    hydrochlorothiazide (HYDRODIURIL) 25 MG tablet Take 1 tablet (25 mg) by mouth daily.    ibuprofen (MOTRIN) 200 MG tablet Take 2 tablets (400 mg) by mouth every 8 hours as needed for Mild Pain (Pain Score 1-3) or Moderate Pain (Pain Score 4-6). Take with food.    levothyroxine (SYNTHROID) 150 MCG tablet Take 1 tablet (150 mcg) by mouth every morning (before breakfast).    lisinopril (PRINIVIL, ZESTRIL) 40 MG tablet Take 1 tablet (40 mg) by mouth daily.    metFORMIN (GLUCOPHAGE) 500 MG tablet Take 1 tablet (500 mg) by mouth daily.    ranitidine (ZANTAC) 150 MG tablet Take 1 tablet (150 mg) by mouth daily as needed for Heartburn.     No current facility-administered medications for this visit.        Review of Systems:    OBJECTIVE:  BP 141/80 (BP  Location: Right arm, BP Patient Position: Sitting)    Pulse 90    Temp 96.6 F (35.9 C) (Oral)    Wt 83.5 kg (184 lb)    SpO2 99%      Gen - well-appearing woman in no acute distress, well developed, very pleasant  HEENT - thyroid not palpable  Cardio - RRR, no m/r/g  Lungs - CTAB, good air movement througout  Extremities - moving all extremities equally, normal gait  Skin - Hair has new growth, no evidence of peeling or dry skin    Labs:    Results for Felicia [name], Felicia [name] (MRN [id number]) as of 03/23/2017 22:58   Ref. Range 02/05/2017 00:00   Cholesterol Latest Ref Range: <200 mg/dL 952190   LDL-Cholesterol Latest Units: mg/dL (calc) 841110 (H)   HDL Cholesterol Latest Ref Range: >50 mg/dL 60   Chol/HDLC Ratio Latest Ref Range: <5.0 (calc) 3.2   Non-HDL Cholesterol Latest Ref Range: <130 mg/dL (calc) 324130 (H)   Hgb M0NA1C Latest Ref Range: <5.7 % of total Hgb 6.0 (H)   Triglycerides Latest Ref Range: <150 mg/dL 027102       FM PHQ2 2/53/66442/20/2019 07/03/2016 07/19/2015 04/26/2015 02/01/2015 08/03/2014 05/11/2014   PHQ2 Total 2 0 0 0 0 0 2  Assessment and Plan: Felicia [name] is our 62 y/o F with hypothyroidism, DM2, HTN here today for follow up.       #hypothyroid   On levothyroxine 150mg   Last TSH .65 wnl (10/18)  No signs or symptoms today.   No enlargements on physical exam.  -Consider stopping next visit if TSH rechecked wnl.     #DM  Last A1c 6.0. Up from 5.5.  -Continue metformin 500 mg  -On ASA and atorvastatin 40mg   -Encouraged today on diet and exercise   -Dental referral for next available clinic appointment    #HTN   141/80 today.   -On amlodipine 10 since ED visit. No dizziness or orthostatis since stopping metoprolol in Nov..  -Continue Lisinopril 40mg  qd, Amlodipine 10mg , HCTZ 25mg      #GERD   -Continue Ranitidine     Health Care Maintenance: (per 11/18 visit)  -Mammo 08/2016--> due 08/2017  -Pap 08/2016 negative --> due 08/2019  -FOBT 06/2016 negative --> due 06/2017  -Diabetic eye exam 08/2016 --> due 08/2018  -Flu shot  12/25/2016  -Prevnar 08/07/2014  -HIV/HepC neg 11/07/2016  -Needs Tdap      Patient to RTC on: three months    To do items at next visit:  [ ]  f/u bp, refill medication  [ ]  f/u dental appointment    Patient seen, discussed and examined with Dr. Katrinka Blazing

## 2017-05-28 ENCOUNTER — Ambulatory Visit: Payer: Self-pay | Admitting: Family Practice

## 2017-05-28 ENCOUNTER — Encounter: Payer: Self-pay | Admitting: Family Practice

## 2017-05-28 VITALS — BP 130/70 | HR 111 | Temp 101.4°F | Wt 184.0 lb

## 2017-05-28 DIAGNOSIS — I10 Essential (primary) hypertension: Secondary | ICD-10-CM

## 2017-05-28 DIAGNOSIS — K219 Gastro-esophageal reflux disease without esophagitis: Secondary | ICD-10-CM

## 2017-05-28 DIAGNOSIS — R197 Diarrhea, unspecified: Principal | ICD-10-CM

## 2017-05-28 DIAGNOSIS — E039 Hypothyroidism, unspecified: Secondary | ICD-10-CM

## 2017-05-28 DIAGNOSIS — E1169 Type 2 diabetes mellitus with other specified complication: Secondary | ICD-10-CM

## 2017-05-28 DIAGNOSIS — E785 Hyperlipidemia, unspecified: Secondary | ICD-10-CM

## 2017-05-28 DIAGNOSIS — M545 Low back pain, unspecified: Secondary | ICD-10-CM

## 2017-05-28 DIAGNOSIS — E119 Type 2 diabetes mellitus without complications: Secondary | ICD-10-CM

## 2017-05-28 MED ORDER — IBUPROFEN 200 MG OR TABS
400.0000 mg | ORAL_TABLET | Freq: Three times a day (TID) | ORAL | 0 refills | Status: DC | PRN
Start: 2017-05-28 — End: 2017-08-27

## 2017-05-28 MED ORDER — LEVOTHYROXINE SODIUM 150 MCG OR TABS
150.0000 ug | ORAL_TABLET | Freq: Every day | ORAL | 0 refills | Status: DC
Start: 2017-05-28 — End: 2017-08-27

## 2017-05-28 MED ORDER — ATORVASTATIN CALCIUM 40 MG OR TABS
40.0000 mg | ORAL_TABLET | Freq: Every day | ORAL | 0 refills | Status: DC
Start: 2017-05-28 — End: 2017-08-27

## 2017-05-28 MED ORDER — HYDROCHLOROTHIAZIDE 25 MG OR TABS
25.0000 mg | ORAL_TABLET | Freq: Every day | ORAL | 0 refills | Status: DC
Start: 2017-05-28 — End: 2017-08-27

## 2017-05-28 MED ORDER — RANITIDINE HCL 150 MG OR TABS
150.0000 mg | ORAL_TABLET | Freq: Every day | ORAL | 0 refills | Status: DC | PRN
Start: 2017-05-28 — End: 2017-08-27

## 2017-05-28 MED ORDER — LOPERAMIDE HCL 2 MG OR CAPS
2.00 mg | ORAL_CAPSULE | Freq: Four times a day (QID) | ORAL | 0 refills | Status: DC | PRN
Start: 2017-05-28 — End: 2017-08-27

## 2017-05-28 MED ORDER — LISINOPRIL 40 MG OR TABS
40.00 mg | ORAL_TABLET | Freq: Every day | ORAL | 0 refills | Status: DC
Start: 2017-05-28 — End: 2017-08-27

## 2017-05-28 MED ORDER — AMLODIPINE 10 MG OR TABS
10.0000 mg | ORAL_TABLET | Freq: Every day | ORAL | 0 refills | Status: DC
Start: 2017-05-28 — End: 2017-08-27

## 2017-05-28 MED ORDER — METFORMIN HCL 500 MG OR TABS
500.00 mg | ORAL_TABLET | Freq: Every day | ORAL | 0 refills | Status: DC
Start: 2017-05-28 — End: 2017-08-27

## 2017-05-28 MED ORDER — ASPIRIN EC 81 MG OR TBEC (CUSTOM)
81.0000 mg | DELAYED_RELEASE_TABLET | Freq: Every day | ORAL | 0 refills | Status: DC
Start: 2017-05-28 — End: 2017-08-27

## 2017-05-28 NOTE — Progress Notes (Addendum)
[facility] Progress Note    Student(s): Idamae Lusher     Attending: Rebeca Alert    SUBJECTIVE:    Felicia [name] is a 62 year old female with   Chief Complaint   Patient presents with    Abdominal Pain    Fever    Diarrhea     #Fever  24H subjective fevers, chills, diarrhea, starting ~0900. No myalgias. -n/v. No recent travel. Husband had the same s/s for a week. Got flu shot this year.    #Abdominal Discomfort  A lot of "movment", but no pain. +urgency for BM.     Past Medical History:   Diagnosis Date    Back pain at L4-L5 level 11/2014    Diabetes mellitus type 2, controlled, without complications (CMS-HCC)     GERD (gastroesophageal reflux disease)     Hyperlipidemia associated with type 2 diabetes mellitus (CMS-HCC)     Hypertension     Hypothyroidism     Obesity      Family History   Problem Relation Name Age of Onset    Cancer Unknown      Diabetes Unknown      Hypertension Mother       Current Outpatient Medications   Medication Sig    amLODIPINE (NORVASC) 10 MG tablet Take 1 tablet (10 mg) by mouth daily.    aspirin 81 MG EC tablet Take 1 tablet (81 mg) by mouth daily.    atorvastatin (LIPITOR) 40 MG tablet Take 1 tablet (40 mg) by mouth daily.    hydrochlorothiazide (HYDRODIURIL) 25 MG tablet Take 1 tablet (25 mg) by mouth daily.    ibuprofen (MOTRIN) 200 MG tablet Take 2 tablets (400 mg) by mouth every 8 hours as needed for Mild Pain (Pain Score 1-3) or Moderate Pain (Pain Score 4-6). Take with food.    levothyroxine (SYNTHROID) 150 MCG tablet Take 1 tablet (150 mcg) by mouth every morning (before breakfast).    lisinopril (PRINIVIL, ZESTRIL) 40 MG tablet Take 1 tablet (40 mg) by mouth daily.    loperamide (IMODIUM) 2 MG capsule Take 1 capsule (2 mg) by mouth 4 times daily as needed for Diarrhea.    metFORMIN (GLUCOPHAGE) 500 MG tablet Take 1 tablet (500 mg) by mouth daily.    ranitidine (ZANTAC) 150 MG tablet Take 1 tablet (150 mg) by mouth daily as needed  for Heartburn.     No current facility-administered medications for this visit.      Review of Systems   Constitutional: Positive for chills and fever.   Respiratory: Negative for cough and shortness of breath.    Cardiovascular: Negative for chest pain.   Gastrointestinal: Positive for abdominal pain and diarrhea. Negative for nausea and vomiting.   Endocrine: Positive for polyuria. Negative for polydipsia.   Musculoskeletal: Negative for myalgias.   Neurological: Positive for dizziness. Negative for syncope and light-headedness.         OBJECTIVE:  BP 130/70 (BP Location: Right arm, BP Patient Position: Sitting)    Pulse 111    Temp 101.4 F (38.6 C)    Wt 83.5 kg (184 lb)    SpO2 96%   Gen: nad  Lungs:  ctab  CV: rrr -mirg  Abd: soft ntnd  Ext: pulses 2+, no edema    Labs: no new labs    [facility] 03/19/2017 07/03/2016 07/19/2015 04/26/2015 02/01/2015 08/03/2014   PHQ2 Total 2 0 0 0 0 0   Some recent data might  be hidden         Assessment and Plan:  The pt presents with subjective fevers at home and at temperature of 38.6C in triage.  The pt is not hypotensive and has been hemodynamically stable while in the clinic. My working diagnosis for the above complaint is viral gastroenteritis    The History and Physical support the diagnosis of viral illness, nos.  -supportive care only  -loperamide for diarrhea  -APAP 1g q8 for fever  -encourage PO fluids  -drink juice is symptoms of low sugars   -encourage 8 hours sleep, good sleep hygiene    I have discussed the above findings and my thought process with the attending, Dr. Rebeca Alert.     Patient to RTC on:  PRN    Edwyna Ready, MS4  05/28/2017, 6:54 PM

## 2017-05-28 NOTE — Interdisciplinary (Signed)
Felicia [name] applied for ERM right after our last visit. She is unsure if the insurance is active, she will call the [facility] representative to make sure it is active. She brought in envelopes today from [employer], I do believe she has the covarage. She is still going to call to make sure she gets a physical card sent to her house. Felicia [name] will be able to cover the costs of her hospital visit last December with her insurance.     Felicia [name] came in with a fever and a cold. The student told her she may have virus. The doctor prescribed her some medicine that can help with her stomach.     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 She still isn't in contact with her step grandchildren. She needs more time to forgive them.    Felicia [name]: Social Work Intern  Approved by: Tobi Bastos, BSW

## 2017-06-11 ENCOUNTER — Encounter: Payer: Self-pay | Admitting: Family Practice

## 2017-07-04 ENCOUNTER — Emergency Department (HOSPITAL_BASED_OUTPATIENT_CLINIC_OR_DEPARTMENT_OTHER)
Admission: EM | Admit: 2017-07-04 | Discharge: 2017-07-04 | Disposition: A | Discharge status: Home / Self Care | Payer: Medicare Other | Attending: Emergency Medicine | Admitting: Emergency Medicine

## 2017-07-04 ENCOUNTER — Other Ambulatory Visit: Payer: Self-pay

## 2017-07-04 ENCOUNTER — Encounter (HOSPITAL_BASED_OUTPATIENT_CLINIC_OR_DEPARTMENT_OTHER): Payer: Self-pay

## 2017-07-04 ENCOUNTER — Emergency Department (HOSPITAL_BASED_OUTPATIENT_CLINIC_OR_DEPARTMENT_OTHER): Payer: Medicare Other

## 2017-07-04 DIAGNOSIS — Z79899 Other long term (current) drug therapy: Secondary | ICD-10-CM | POA: Diagnosis not present

## 2017-07-04 DIAGNOSIS — Z7901 Long term (current) use of anticoagulants: Secondary | ICD-10-CM | POA: Diagnosis not present

## 2017-07-04 DIAGNOSIS — Y929 Unspecified place or not applicable: Secondary | ICD-10-CM | POA: Diagnosis not present

## 2017-07-04 DIAGNOSIS — Y998 Other external cause status: Secondary | ICD-10-CM | POA: Insufficient documentation

## 2017-07-04 DIAGNOSIS — Y9301 Activity, walking, marching and hiking: Secondary | ICD-10-CM | POA: Insufficient documentation

## 2017-07-04 DIAGNOSIS — W2201XA Walked into wall, initial encounter: Secondary | ICD-10-CM | POA: Diagnosis not present

## 2017-07-04 DIAGNOSIS — S6991XA Unspecified injury of right wrist, hand and finger(s), initial encounter: Secondary | ICD-10-CM | POA: Diagnosis present

## 2017-07-04 DIAGNOSIS — M20021 Boutonniere deformity of right finger(s): Secondary | ICD-10-CM | POA: Diagnosis not present

## 2017-07-04 DIAGNOSIS — Z87891 Personal history of nicotine dependence: Secondary | ICD-10-CM | POA: Diagnosis not present

## 2017-07-04 HISTORY — DX: Ankylosing spondylitis of unspecified sites in spine: M45.9

## 2017-07-04 HISTORY — DX: Unspecified osteoarthritis, unspecified site: M19.90

## 2017-07-04 NOTE — Discharge Instructions (Addendum)
You may take Tylenol and use cool compresses to help with your symptoms of pain you were given a splint for your finger which you need to wear at all times until you can follow-up with a hand surgeon.  Please call your primary doctor as soon as possible to get a referral to a hand surgeon.

## 2017-07-04 NOTE — ED Provider Notes (Signed)
[facility] EMERGENCY DEPARTMENT Provider Note   CSN: [id number] Arrival date & time: 07/04/17  2025     History   Chief Complaint Chief Complaint  Patient presents with  . Finger Injury    HPI Tammy Sanford is a 62 y.o. female.  HPI   62 year old female presenting with right little finger injury that occurred suddenly today.  Symptoms are constant.  No aggravating or alleviating factors.  Patient states that she stumbled while walking and fell forward hitting her right little finger on a wall.  Normal sensation to the finger.  Pain is mild.  Unable to fully extend at the distal part of her right pinky.  No head trauma or LOC.  No other injuries.  Past Medical History:  Diagnosis Date  . Ankylosing spondylitis (HCC)   . Arthritis     There are no active problems to display for this patient.   Past Surgical History:  Procedure Laterality Date  . ABDOMINAL HYSTERECTOMY    . KNEE SURGERY       OB History   None      Home Medications    Prior to Admission medications   Medication Sig Start Date End Date Taking? Authorizing Provider  atorvastatin (LIPITOR) 20 MG tablet  05/04/17   [provider]  COMBIGAN 0.2-0.5 % ophthalmic solution  05/14/17   [provider]  lisinopril (PRINIVIL,ZESTRIL) 40 MG tablet Take by mouth.    [provider]  Rivaroxaban (XARELTO) 15 MG TABS tablet Take by mouth.    [provider]    Family History No family history on file.  Social History Social History   Tobacco Use  . Smoking status: Former Games developermoker  . Smokeless tobacco: Never Used  Substance Use Topics  . Alcohol use: Never    Frequency: Never  . Drug use: Never     Allergies   Ativan [lorazepam]; Benadryl [diphenhydramine]; and Sulfa antibiotics   Review of Systems Review of Systems  Musculoskeletal:       Right 5th finger injury  Neurological: Negative for numbness.     Physical Exam Updated Vital Signs BP  103/70 (BP Location: Left Arm)   Pulse 86   Temp 98.3 F (36.8 C) (Oral)   Resp 16   Ht 5\' 6"  (1.676 m)   Wt 91.8 kg (202 lb 6.4 oz)   SpO2 100%   BMI 32.67 kg/m   Physical Exam  Constitutional: She appears well-developed and well-nourished. No distress.  HENT:  Head: Normocephalic and atraumatic.  Eyes: Conjunctivae are normal.  Neck: Neck supple.  Cardiovascular: Normal rate.  Pulmonary/Chest: Effort normal.  Musculoskeletal:  Mild tenderness to the right fifth digit.  Unable to extend finger at DIP.  Normal flexion and extension at PIP and MCP.  Normal range of motion at the wrist.  Distal sensation intact.  Brisk cap refill.  No significant swelling or erythema.  Neurological: She is alert.  Skin: Skin is warm and dry. Capillary refill takes less than 2 seconds.  Psychiatric: She has a normal mood and affect.  Nursing note and vitals reviewed.    ED Treatments / Results  Labs (all labs ordered are listed, but only abnormal results are displayed) Labs Reviewed - No data to display  EKG None  Radiology Dg Finger Little Right  Result Date: 07/04/2017 CLINICAL DATA:  Status post fall with pain of the fifth digit. EXAM: RIGHT LITTLE FINGER 2+V COMPARISON:  None. FINDINGS: There is no evidence of  fracture or dislocation. Soft tissues are unremarkable. IMPRESSION: No acute fracture or dislocation. Electronically Signed   By: Sherian Rein M.D.   On: 07/04/2017 21:16    Procedures Procedures (including critical care time)  Medications Ordered in ED Medications - No data to display   Initial Impression / Assessment and Plan / ED Course  I have reviewed the triage vital signs and the nursing notes.  Pertinent labs & imaging results that were available during my care of the patient were reviewed by me and considered in my medical decision making (see chart for details).  Final Clinical Impressions(s) / ED Diagnoses   Final diagnoses:  Boutonniere deformity of finger,  right   Patient presented with right finger pain after injury.  Vital signs stable and afebrile.  X-ray of the right fifth digit is normal.  Denies any other injuries from the fall but is unable to fully extend right fifth finger at the DIP joint.  Flexion is intact at the DIP joint.  Flexion and extension is intact at PIP and MCP joints as well as at the wrist.  Will splint patient.  She is from out of town.  Advised that she needs to follow-up with hand surgeon when she arrives home and that she will need to wear the splint at all times until she can follow-up with a hand surgeon.  Advised to strict return precautions for any new or worsening symptoms.  All questions answered and patient understand the plan and reasons to return immediately to the ED.  ED Discharge Orders    None       Rayne Du 07/04/17 2151    Terrilee Files, MD 07/05/17 1243

## 2017-07-04 NOTE — ED Triage Notes (Signed)
Pt tripped/fell approx 2pm-injured right pinky finger-deformity noted-NAD-steady gait

## 2017-07-04 NOTE — ED Notes (Signed)
PMS intact before and after. Pt tolerated well. All questions answered. 

## 2017-08-27 ENCOUNTER — Ambulatory Visit: Payer: Self-pay | Admitting: Internal Medicine

## 2017-08-27 DIAGNOSIS — E119 Type 2 diabetes mellitus without complications: Secondary | ICD-10-CM

## 2017-08-27 DIAGNOSIS — I10 Essential (primary) hypertension: Secondary | ICD-10-CM

## 2017-08-27 DIAGNOSIS — M545 Low back pain, unspecified: Secondary | ICD-10-CM

## 2017-08-27 DIAGNOSIS — K219 Gastro-esophageal reflux disease without esophagitis: Secondary | ICD-10-CM

## 2017-08-27 DIAGNOSIS — E039 Hypothyroidism, unspecified: Secondary | ICD-10-CM

## 2017-08-27 DIAGNOSIS — E785 Hyperlipidemia, unspecified: Secondary | ICD-10-CM

## 2017-08-27 MED ORDER — LISINOPRIL 40 MG OR TABS
40.00 mg | ORAL_TABLET | Freq: Every day | ORAL | 0 refills | Status: DC
Start: 2017-08-27 — End: 2017-11-19

## 2017-08-27 MED ORDER — HYDROCHLOROTHIAZIDE 25 MG OR TABS
25.00 mg | ORAL_TABLET | Freq: Every day | ORAL | 0 refills | Status: DC
Start: 2017-08-27 — End: 2017-11-19

## 2017-08-27 MED ORDER — RANITIDINE HCL 150 MG OR TABS
150.00 mg | ORAL_TABLET | Freq: Every day | ORAL | 0 refills | Status: DC | PRN
Start: 2017-08-27 — End: 2017-11-19

## 2017-08-27 MED ORDER — METFORMIN HCL 500 MG OR TABS
500.00 mg | ORAL_TABLET | Freq: Every day | ORAL | 0 refills | Status: DC
Start: 2017-08-27 — End: 2017-08-27

## 2017-08-27 MED ORDER — LEVOTHYROXINE SODIUM 150 MCG OR TABS
150.00 ug | ORAL_TABLET | Freq: Every day | ORAL | 0 refills | Status: DC
Start: 2017-08-27 — End: 2017-11-19

## 2017-08-27 MED ORDER — IBUPROFEN 200 MG OR TABS
400.00 mg | ORAL_TABLET | Freq: Every evening | ORAL | 0 refills | Status: DC
Start: 2017-08-27 — End: 2017-11-19

## 2017-08-27 MED ORDER — ATORVASTATIN CALCIUM 40 MG OR TABS
40.00 mg | ORAL_TABLET | Freq: Every day | ORAL | 0 refills | Status: DC
Start: 2017-08-27 — End: 2017-11-19

## 2017-08-27 MED ORDER — IBUPROFEN 200 MG OR TABS
400.00 mg | ORAL_TABLET | Freq: Three times a day (TID) | ORAL | 0 refills | Status: DC | PRN
Start: 2017-08-27 — End: 2017-08-27

## 2017-08-27 MED ORDER — ASPIRIN EC 81 MG OR TBEC
81.00 mg | DELAYED_RELEASE_TABLET | Freq: Every day | ORAL | 0 refills | Status: DC
Start: 2017-08-27 — End: 2017-11-19

## 2017-08-27 MED ORDER — AMLODIPINE 10 MG OR TABS
10.00 mg | ORAL_TABLET | Freq: Every day | ORAL | 0 refills | Status: DC
Start: 2017-08-27 — End: 2017-11-19

## 2017-08-27 NOTE — Progress Notes (Signed)
[facility] Progress Note    Student(s): Horton Finer MS2    Attending: Dr. Tinnie Gens Applestein      SUBJECTIVE:    Felicia [name] is a 62 year old female with a history of DM2, HTN, anxiety, and hypothyroidism presenting today for follow up of chronic conditions and for new complaints of eye discomfort and ankle joint pain. She also has several questions about supplements that she is taking.     Chief Complaint   Patient presents with    Eye Pain    Ankle Pain     #Eye Pain  Started 3 days ago. Complains of bilateral eye pain, itchiness, and blurriness. She states that they feel like there is dirt in her eyes and until she rubs them it's as though there's a film or membrane over her eyes making her vision blurry. Her glasses are also unbearable as of last three days. Last optometry appt reported to be 1 year ago at which point also got glasses. Has tried to put chamomile drops in and has used ice which have both helped. No headaches, no dizziness, no light headedness. Is drinking 3 bottles of water daily and no other symptoms have presented themselves.    #Ankle Pain  Complains of 2 months of bilateral ankle pain, exacerbated by movement and worst in the am. She gets shooting pain that originates at her medial mid-calf and radiates around her ankles. This is affecting her daily life as she is on her feet most of the day. She has had some relief with ibuprofen.     #DM  She reports that she is taking her medications as directed with the exception of her metformin (500mg  daily) which she only takes every 3-4 days. She takes a supplement from a company that her daughter works at daily which she states is intended to help control diabetes. She says that she doesn't identify with the label of diabetes and feels like she never understood why she had to take medication for it since she feels so well. She is interested in reducing the number of medications she is taking.  Of note, she does not know  what is in the supplements she's been taking. She says she has also taken ones for her gallbladder and her liver as a cleanse, and states that they helped her lose weight and excrete gall stones.       Current Outpatient Medications   Medication Sig    amLODIPINE (NORVASC) 10 MG tablet Take 1 tablet (10 mg) by mouth daily.    aspirin 81 MG EC tablet Take 1 tablet (81 mg) by mouth daily.    atorvastatin (LIPITOR) 40 MG tablet Take 1 tablet (40 mg) by mouth daily.    hydrochlorothiazide (HYDRODIURIL) 25 MG tablet Take 1 tablet (25 mg) by mouth daily.    ibuprofen (MOTRIN) 200 MG tablet Take 2 tablets (400 mg) by mouth every 8 hours as needed for Mild Pain (Pain Score 1-3) or Moderate Pain (Pain Score 4-6). Take with food.    levothyroxine (SYNTHROID) 150 MCG tablet Take 1 tablet (150 mcg) by mouth every morning (before breakfast).    lisinopril (PRINIVIL, ZESTRIL) 40 MG tablet Take 1 tablet (40 mg) by mouth daily.    metFORMIN (GLUCOPHAGE) 500 MG tablet Take 1 tablet (500 mg) by mouth daily.    ranitidine (ZANTAC) 150 MG tablet Take 1 tablet (150 mg) by mouth daily as needed for Heartburn.     No current facility-administered  medications for this visit.        Review of Systems:  HEENT: Eyes: +pain +blurriness +itchiness/burning +excess tears  CV: No chest pain, no palpitations  Pulm: no sob   GI: no n/v/d   GU: no changes in urination    OBJECTIVE:  BP 142/60    Pulse 99    Temp 98.7 F (37.1 C) (Oral)    Wt 84.4 kg (186 lb)    SpO2 96%   Gen: well nourished well appearing female in mild distress today  Lungs: normal breath sounds  CV: normal rate and rhythm    #Eyes: mild redness of sclera with normal appearing irises and conjunctiva. Sensitivity to light increased, with discomfort upon movement of eyes. Small grey substance on right medial iris, which she states has been there for many months. No discharge.     MSK: She has full range of motion in both ankles, with preserved dorsalis pedis and posterior  tibialis pulses, however she had crepitus through full range of motion. She is also extremely tender to palpation and more extreme movements of the ankles elicit strong pain response from patient.  She is also experiencing pain in her rotator cuffs, worse on her right shoulder, and in addition is having pain in her finger joints.      Labs:    [facility] 08/27/2017 03/19/2017 07/03/2016 07/19/2015 04/26/2015 02/01/2015   PHQ2 Total 3 2 0 0 0 0   Some recent data might be hidden     Last 5 PHQ Scores      Office Visit from 08/27/2017 in [facility] Office Visit from 03/19/2017 in [place] [facility] Office Visit from 07/03/2016 in [place] [facility] [place] Office Visit from 07/19/2015 in [place] [facility] Office Visit from 04/26/2015 in [place] [facility] [place]   PHQ-2  3  2   0  0  0   PHQ-9  8  --  --  --  --          Assessment and Plan:    #Eye Pain:  Patient will increase fluids and buy otc eye drops (Refresh). An order was placed with [facility] for restasis drops, which will be given to her once it arrives. She should RTC if symptoms worsen.    #Ankle Pain:  Dr. Leanna Sato believes Felicia [name] may be experiencing mild arthritis. As she has tolerated ibuprofen well in the past and reports it has helped her pain, she will now take 400mg  of ibuprofen at night before bed to address worst pain in the mornings. She can also elevate her ankles when she is experiencing pain and swelling. She also was given some stretches to help her rotator cuff pain.     #DM:  At last check, A1C was 6.0 (January 2019). She now reports she takes Metformin 500mg  only every 3 days rather than daily as prescribed and feels well. She exercises daily. As such, it is reasonable to try removing her Metformin, so Metformin was discontinued today. She should return to clinic in 2.5 months to get an A1C drawn and then 2 weeks later will have a 3 month follow up  to see the lab results. If her blood sugar control remains strong, can stay off of the Metformin. If it has risen, she knows that we will be addressing this with medication.      Health Care Maintenance:  Did not address today  Patient to RTC on:  - Oct 9th for A1C labs only  - Oct 23rd for full follow up    To do items at next visit:  -Make sure her A1C is still under control after removal of 500mg  Metformin. If has increased again, please re-address or re-prescribe diabetes medications  -Please see if she brought her supplements in and look at ingredients      Patient seen, discussed and examined with Dr. Tinnie GensJeffrey Applestein

## 2017-09-01 ENCOUNTER — Encounter: Payer: Self-pay | Admitting: Internal Medicine

## 2017-09-01 NOTE — Progress Notes (Signed)
I have examined and discussed this patient and agree with the plan

## 2017-09-17 ENCOUNTER — Ambulatory Visit: Payer: Self-pay | Admitting: Ophthalmology

## 2017-09-17 DIAGNOSIS — E119 Type 2 diabetes mellitus without complications: Secondary | ICD-10-CM

## 2017-09-19 NOTE — Progress Notes (Signed)
[facility] Specialty Clinic Note  Judeth HornMaria Felicia [name]  February 04, 1955  [id number]  09/19/17      Reason for Referral:    SUBJECTIVE::Linetta Karle PlumberMoreno Derksen is a 62 year old female with h/o eye pain and irritation and T2DM here for ophthalmologic visit.    Pt reports pain and irritation in both eyes since the end of July. Feels pain when using glasses and therefore uses less. Finds herself tearing up often, and feeling like there is fabric or sand in eyes. Wakes up with increased discharge crusting at eyes. Has tried OTC lubricant eye drops, but with little relief. Denies any vision changes.    Past Medical History  No past medical history on file.    Medications/Allergies:     No current outpatient medications on file.     No current facility-administered medications for this visit.        No Known Allergies    No family history on file.      Exam  Pt experienced much irritation and pain with all eye drops (proparacaine, phenylephrine, and tropicamide) noticeably more pain than other patients.    Vision           Near:  OD 20/40 -1  sc  OS 20/30 -2    Pupillary Reflex: PERRLA    EOM: EOMI yes  CVF: Normal    IOP (tono)  OD 13  OS  10    Slit Lamp   LL: wnl OU  CS: pterygia OU  K: clear OU  AC: deep and quiet OU  Iris: round and dilated OU, no NVI OU  Lens: wnl OU    Dilated Fundus Exam   Disc:  pink/sharp/flat OU, no neovascularization   C/D: 0.2/0.2  Macula : flat, no exudates/hemorrhage or edema OU   Vessels: within normal limits OU   Periphery: within normal limits OU, no retinal tear or detachment visualized, no DR OU      Assessment/Plan:    Dry Eye OU: Recommended Artificial tears PRN  T2DM: no DR, Blood sugar and blood control with annual dilated fundus exam      Attending: Westley ChandlerAndrew Camp, MD  Note written by medical student: Larkin InaIris Garcia-Pak, MS2    I examined this patient and agree with the above exam findings and assessment/plan.  Jillene BucksAndrew Steven Camp, MD

## 2017-09-22 DIAGNOSIS — E119 Type 2 diabetes mellitus without complications: Secondary | ICD-10-CM | POA: Insufficient documentation

## 2017-11-05 ENCOUNTER — Other Ambulatory Visit: Payer: Self-pay | Admitting: Family Practice

## 2017-11-05 ENCOUNTER — Encounter: Payer: Self-pay | Admitting: Family Practice

## 2017-11-05 DIAGNOSIS — E119 Type 2 diabetes mellitus without complications: Secondary | ICD-10-CM

## 2017-11-05 NOTE — Interdisciplinary (Signed)
Delainey brought in a bill she received from her recent mammogram done on 23 September and a Medical notice of rights.   The Medical paper is routine and is usually provided to all Medical recipients to inform them of their rights.  Pt was referred to mammogram clinic from [facility] clinic. Pt states she provided the "ballerina" referral form she was given from the referring clinic, [facility] during her  check-in. Pt stated that form was was scanned and returned to her.  Pt stated she is concerned because she cannot afford to pay.     I made copies of her Medical card and her bill and informed her that [place] or I would call the clinic to inquire about what happened.   Pt asked if we could follow-up with her face to face during her next appointment scheduled on 23 October.    Luther Redo, Social Work Intern  Approved by: Tobi Bastos, BSW

## 2017-11-05 NOTE — Progress Notes (Signed)
Vitals 11/05/17    94 bpm  16 resps/min  97.2 temp  98% SPO2  144/60  186 lbs

## 2017-11-05 NOTE — Progress Notes (Signed)
Primary Care FOLLOW UP VISIT    Routine Follow Up Appointment: PRIMARY CARE    Felicia [name] is a 62 year old year old female with a who comes in for routine follow up.     [facility] Progress Note    Student(s): Nelia Shi, MS4     Attending: ***      SUBJECTIVE:    Felicia [name] is a 62 year old female with a history of DM2, HTN, anxiety, and hypothyroidism presenting today for follow up.       Current Outpatient Medications   Medication Sig   . amLODIPINE (NORVASC) 10 MG tablet Take 1 tablet (10 mg) by mouth daily.   Marland Kitchen aspirin 81 MG EC tablet Take 1 tablet (81 mg) by mouth daily.   Marland Kitchen atorvastatin (LIPITOR) 40 MG tablet Take 1 tablet (40 mg) by mouth daily.   . hydrochlorothiazide (HYDRODIURIL) 25 MG tablet Take 1 tablet (25 mg) by mouth daily.   Marland Kitchen ibuprofen (MOTRIN) 200 MG tablet Take 2 tablets (400 mg) by mouth at bedtime. Take with food.   Marland Kitchen levothyroxine (SYNTHROID) 150 MCG tablet Take 1 tablet (150 mcg) by mouth every morning (before breakfast).   Marland Kitchen lisinopril (PRINIVIL, ZESTRIL) 40 MG tablet Take 1 tablet (40 mg) by mouth daily.   . ranitidine (ZANTAC) 150 MG tablet Take 1 tablet (150 mg) by mouth daily as needed for Heartburn.     No current facility-administered medications for this visit.        Review of Systems:  Review of Systems      OBJECTIVE:  There were no vitals taken for this visit.  Gen:  Lungs:   CV:   Abd:   Ext:     Labs:    FM PHQ2 08/27/2017 03/19/2017 07/03/2016 07/19/2015 04/26/2015 02/01/2015   PHQ2 Total 3 2 0 0 0 0   Some recent data might be hidden     Lab Results   Component Value Date    BUN 21 11/07/2016    BUN 25 02/01/2015    CREAT 0.67 11/07/2016    CREAT 0.80 02/01/2015    CL 103 11/07/2016    CL 102 02/01/2015    NA 138 11/07/2016    NA 137 02/01/2015    K 4.7 11/07/2016    K 4.2 02/01/2015    [place] 9.8 11/07/2016     9.7 02/01/2015    BICARB 22 11/07/2016    BICARB 26 02/01/2015    GLU 81 11/07/2016    GLU 109 (H) 02/01/2015     Lab Results      Component Value Date    A1C 6.0 (H) 02/05/2017    A1C 6.9 (H) 02/01/2015       Assessment and Plan:    Health Care Maintenance:  Contraception:  Immunizations:  Cancer Screening:    Patient to RTC on:    To do items at next visit:      Patient seen, discussed and examined with ***

## 2017-11-05 NOTE — Progress Notes (Signed)
Limited visit  Blood drawn for A1C  To follow up with comprehensive primary care visit on 11/19/2017    Suzette Battiest, MS2

## 2017-11-07 LAB — GLYCOSYLATED HGB(A1C), BLOOD: Hgb A1C: 6.2 % of total Hgb — ABNORMAL HIGH (ref <5.7)

## 2017-11-19 ENCOUNTER — Ambulatory Visit: Payer: Self-pay | Admitting: Family Practice

## 2017-11-19 DIAGNOSIS — I10 Essential (primary) hypertension: Secondary | ICD-10-CM

## 2017-11-19 DIAGNOSIS — E785 Hyperlipidemia, unspecified: Secondary | ICD-10-CM

## 2017-11-19 DIAGNOSIS — K219 Gastro-esophageal reflux disease without esophagitis: Secondary | ICD-10-CM

## 2017-11-19 DIAGNOSIS — E119 Type 2 diabetes mellitus without complications: Secondary | ICD-10-CM

## 2017-11-19 DIAGNOSIS — E1169 Type 2 diabetes mellitus with other specified complication: Secondary | ICD-10-CM

## 2017-11-19 DIAGNOSIS — M545 Low back pain, unspecified: Secondary | ICD-10-CM

## 2017-11-19 DIAGNOSIS — E039 Hypothyroidism, unspecified: Principal | ICD-10-CM

## 2017-11-19 MED ORDER — ASPIRIN EC 81 MG OR TBEC (CUSTOM)
81.0000 mg | DELAYED_RELEASE_TABLET | Freq: Every day | ORAL | 0 refills | Status: DC
Start: 2017-11-19 — End: 2018-02-18

## 2017-11-19 MED ORDER — LISINOPRIL 40 MG OR TABS
40.0000 mg | ORAL_TABLET | Freq: Every day | ORAL | 0 refills | Status: DC
Start: 2017-11-19 — End: 2018-02-18

## 2017-11-19 MED ORDER — ATORVASTATIN CALCIUM 40 MG OR TABS
40.0000 mg | ORAL_TABLET | Freq: Every day | ORAL | 0 refills | Status: DC
Start: 2017-11-19 — End: 2018-02-18

## 2017-11-19 MED ORDER — RANITIDINE HCL 150 MG OR TABS
150.0000 mg | ORAL_TABLET | Freq: Every day | ORAL | 0 refills | Status: DC | PRN
Start: 2017-11-19 — End: 2018-02-18

## 2017-11-19 MED ORDER — HYDROCHLOROTHIAZIDE 25 MG OR TABS
25.0000 mg | ORAL_TABLET | Freq: Every day | ORAL | 0 refills | Status: DC
Start: 2017-11-19 — End: 2018-02-18

## 2017-11-19 MED ORDER — LEVOTHYROXINE SODIUM 150 MCG OR TABS
150.0000 ug | ORAL_TABLET | Freq: Every day | ORAL | 0 refills | Status: DC
Start: 2017-11-19 — End: 2018-02-18

## 2017-11-19 MED ORDER — AMLODIPINE 10 MG OR TABS
10.0000 mg | ORAL_TABLET | Freq: Every day | ORAL | 0 refills | Status: DC
Start: 2017-11-19 — End: 2018-02-18

## 2017-11-19 MED ORDER — IBUPROFEN 200 MG OR TABS
400.0000 mg | ORAL_TABLET | Freq: Every evening | ORAL | 0 refills | Status: DC
Start: 2017-11-19 — End: 2018-02-18

## 2017-11-19 NOTE — Progress Notes (Signed)
[facility] [facility] Progress Note    Student(s): [facility] MS1, Dionisio Paschal MS4    Attending: Dr. Joycelyn Man      SUBJECTIVE:    Felicia [name] is a 62 year old female with history of T2DM, HTN, and hypothyroidism who presents for follow up.    Regarding her diabetes, she was last seen in this clinic in July 2019 at which point her metformin was stopped because her A1c was 6.0. Since her last appointment she states she has not had any symptoms of hypoglycemia or hyperglycemia including dizziness, shaking, nausea, vomiting.    Regarding her hypothyroidism, she reports taking Synthroid daily with complete resolution of the symptoms she originally had when diagnosed. She denies any new temperature changes, weight changes, abdominal symptoms, or changes in energy levels.     Regarding her HTN, she reports being unaware of the diagnosis of hypertension. She states she is taking all three of her anti-hypertensive agents daily without fail but isn't sure why she needs to take the medicine as she isn't experiencing any symptoms from high BP. She denies chest pain, palpitations, dizziness, and syncope.     14 point ROS conducted and notable only for occasional joint pains relieved by ibuprofen.    Current Outpatient Medications   Medication Sig   . amLODIPINE (NORVASC) 10 MG tablet Take 1 tablet (10 mg) by mouth daily.   Marland Kitchen aspirin 81 MG EC tablet Take 1 tablet (81 mg) by mouth daily.   Marland Kitchen atorvastatin (LIPITOR) 40 MG tablet Take 1 tablet (40 mg) by mouth daily.   . hydrochlorothiazide (HYDRODIURIL) 25 MG tablet Take 1 tablet (25 mg) by mouth daily.   Marland Kitchen ibuprofen (MOTRIN) 200 MG tablet Take 2 tablets (400 mg) by mouth at bedtime. Take with food.   Marland Kitchen levothyroxine (SYNTHROID) 150 MCG tablet Take 1 tablet (150 mcg) by mouth every morning (before breakfast).   Marland Kitchen lisinopril (PRINIVIL, ZESTRIL) 40 MG tablet Take 1 tablet (40 mg) by mouth daily.   . ranitidine (ZANTAC) 150 MG tablet Take 1 tablet (150 mg) by  mouth daily as needed for Heartburn.     No current facility-administered medications for this visit.      OBJECTIVE:  BP 150/72 (BP Location: Left arm, BP Patient Position: Sitting, BP cuff size: Regular)   Pulse 98   Temp 98.9 F (37.2 C) (Oral)   Resp 20   Wt 84.4 kg (186 lb)   SpO2 95%   Gen: NAD, sitting comfortably in chair  HEENT: PERRLA, EOMI, MMM, thyroid normal  Lungs: CTABL  CV: RRR, no m/r/g  Abd: NTTP, NABS, soft  Ext: warm, dry, no pedal edema, no sensory deficits, distal pulses intact    Labs:  Reviewed and notable for   A1c of 6.2   TSH 0.69    FM PHQ2 08/27/2017 03/19/2017 07/03/2016 07/19/2015 04/26/2015 02/01/2015   PHQ2 Total 3 2 0 0 0 0   Some recent data might be hidden     Assessment and Plan:  Felicia [name] is a 62 year old female with history of T2DM, HTN, and hypothyroidism who presents for follow up.    #T2DM   Very well controlled at this time. Last A1c 6.2 after stopping metformin. Will plan to continue management with only diet and exercise at this time.   - encourage diet and exercise   - recheck A1c in 3 months     #HTN  Discussed patient's diagnosis and long-term effects of  HTN extensively today. Counseled patient on home blood pressure monitoring with BP log as well as low sodium diet. Plan as below.  - Patient to check BPs 2-3 times weekly at local [facility] and create log to bring to next appointment   - encourage low sodium diet as tolerated   - continue amlodipine, lisinopril, and HCTZ    #Hypothyroidism   Last TSH 0.69 within normal limits but checked one year ago. Patient asymptomatic and stable on current dose of synthroid. Plan to continue synthroid and recheck TSH as annual check.  - check TSH   - continue Synthroid      Health Care Maintenance:  Contraception: not sexually active   Immunizations: due for flu shot  Cancer Screening: Pap negative, Mammogram normal, due for FOBT this year    Patient to RTC on: 3 months     To do items at next visit:  - f/u TSH   -  administer flu shot     Patient seen, discussed and examined with Dr. Algis Liming

## 2017-11-19 NOTE — Progress Notes (Addendum)
Felicia [name] Resident Note    I saw and examined the patient myself.     Agree with history and physical exam documented in the excellent medical student note.    Vital signs reviewed, notable for BP 150/72  Labs reviewed, notable for A1c 6.2, TSH 0.65, LDL 110    Assessment and Plan:  Felicia [name] is a 62 yo F with PMH of diet-controlled DM2, HTN, HLD, and hypothyroidism who presents for routine follow up.    # HTN  BP above goal in [name], though likely elevated due to the [name] environment. Asymptomatic.  - Continue amlodipine 10mg qday, HCTZ 25mg qday and lisinopril 40mg qday  - Patient counseled to check BP approx 3x/week as able at a pharmacy and keep a log of the readings. Patient instructed to bring the BP log to her next visit.  - Discussed the importance of a low-salt diet. Consider re-education and possible Nutrition referral at next visit.  - Check BMP at next visit    # Diet-controlled DM2  Well-controlled (A1c 6.2%) with lifestyle changes, now off all medications.   - Continue management with diet and exercise  - Retinopathy: Diabetic eye exam 08/2016 --> due 08/2018  - Neuropathy: Patient denies neuropathic symptoms  - Nephropathy: None known. Check BMP prior to next visit.  - Continue ASA 81  - Continue atorvastatin 40mg daily. Repeat lipid panel prior to next visit. Last LDL 110 01/2017, slightly above goal with concurrent DM  - Repeat A1c in 3 months at next visit    # Hypothyroidism  TSH wnl 1 year ago. Patient denies current symptoms of hypo- or hyperthyroidism  - Continue synthroid 150 mcg daily  - Check TSH at next appointment    # Health maintenance  -Mammo - patient reports normal mammo 08/2017, does not have report with her today  -Pap 08/2016 negative  -FOBT 06/2016 negative --> due now. Kit provided today, pt instructed to perform and bring to her next lab visit.  -Flu shot 12/25/2016 --> discuss at next visit  -Prevnar 08/07/2014  -Tdap - [name] note from 03/19/17 noted that  pt need Tdap, unclear if administered at that visit. --> discuss further at next visit.  -HIV/HepC neg 11/07/2016    Patient discussed with attending Dr. Smith.

## 2017-11-28 NOTE — Interdisciplinary (Signed)
I was able to reach [facility] regarding the $425.00 bill for Felicia [name]'s recent mammogram and the representative stated that there had been an issue identifying the Pt, however, the issue was resolved after the bill was sent.   Billing agent stated to disregard the bill as it was now 0 balance.   Attempted to contact Pt on 11/28/17 but was unable to reach her. Will attempt at another date.    Luther Redo, Social Work Intern  Approved by: Tobi Bastos, BSW

## 2017-12-17 ENCOUNTER — Encounter: Payer: Self-pay | Admitting: Family Medicine

## 2017-12-17 ENCOUNTER — Ambulatory Visit: Payer: Self-pay | Admitting: Family Practice

## 2017-12-17 VITALS — BP 149/65 | HR 89 | Temp 97.6°F | Resp 16 | Wt 186.0 lb

## 2017-12-17 DIAGNOSIS — I10 Essential (primary) hypertension: Principal | ICD-10-CM

## 2017-12-17 DIAGNOSIS — R079 Chest pain, unspecified: Secondary | ICD-10-CM

## 2017-12-17 NOTE — Progress Notes (Signed)
No chief complaint on file.      HPI: Felicia [name] is a 62 year old female with DM2(well controlled), HTN, HLD presenting for several weeks of atypical chest pain.    Chest pain:  -Left sided, sharp, not associated with activity, no SOB, some radiation in arm/jaw, painful to palpation,   -never had it before,   -improves with motrin,  - no nausea/vomiting/other associated symptoms.   -No worsening fatigue. No change in exercise tolerance. -Lasts for hrs and more often in the night, but not GERD pain.  -no edema, PND, orthopnea    ROS:  As noted in HPI, otherwise  General: No fatigue, no body weight changes. No fevers, no chills.   Neuro: No headache, No numbness or tingling feet.   HEENT: No blurry vision, No hearing loss. No throat or neck lesions, masses or concerns.   CV: No chest pain, no palpitations.   Pulm: No cough, no SOB, no dyspnea.   GI: No abd pain, no nausea, no diarrhea.   GU: No dysuria, no bleeding.   Ortho: No joint pain.   Hem: No claudication sx.   Skin: No rashes. No pruritis.    Past Medical History:   Diagnosis Date   . Back pain at L4-L5 level 11/2014   . Diabetes mellitus type 2, controlled, without complications (CMS-HCC)    . GERD (gastroesophageal reflux disease)    . Hyperlipidemia associated with type 2 diabetes mellitus (CMS-HCC)    . Hypertension    . Hypothyroidism    . Obesity        Past Surgical History:   Procedure Laterality Date   . RIGHT OOPHORECTOMY  2007   . CESAREAN SECTION, CLASSIC         Current Outpatient Medications   Medication Sig   . amLODIPINE (NORVASC) 10 MG tablet Take 1 tablet (10 mg) by mouth daily.   Marland Kitchen aspirin 81 MG EC tablet Take 1 tablet (81 mg) by mouth daily.   Marland Kitchen atorvastatin (LIPITOR) 40 MG tablet Take 1 tablet (40 mg) by mouth daily.   . hydrochlorothiazide (HYDRODIURIL) 25 MG tablet Take 1 tablet (25 mg) by mouth daily.   Marland Kitchen ibuprofen (MOTRIN) 200 MG tablet Take 2 tablets (400 mg) by mouth at bedtime. Take with food.   Marland Kitchen levothyroxine (SYNTHROID)  150 MCG tablet Take 1 tablet (150 mcg) by mouth every morning (before breakfast).   Marland Kitchen lisinopril (PRINIVIL, ZESTRIL) 40 MG tablet Take 1 tablet (40 mg) by mouth daily.   . ranitidine (ZANTAC) 150 MG tablet Take 1 tablet (150 mg) by mouth daily as needed for Heartburn.     No current facility-administered medications for this visit.        No Known Allergies    Social History     Tobacco Use   . Smoking status: Never Smoker   . Smokeless tobacco: Never Used   Substance Use Topics   . Alcohol use: Not on file   . Drug use: No     Social History     Patient does not qualify to have social determinant information on file (likely too young).   Social History Narrative   . Not on file       Family History   Problem Relation Name Age of Onset   . Cancer Unknown     . Diabetes Unknown     . Hypertension Mother           Physical Exam:  BP  149/65 (BP Location: Left arm, BP Patient Position: Sitting, BP cuff size: Regular)   Pulse 89   Temp 97.6 F (36.4 C) (Oral)   Resp 16   Wt 84.4 kg (186 lb)   SpO2 97%     Gen: Alert, oriented, well appearing, in NAD  HEENT: MMM, EOMI, PERRL, oropharynx clear  Cardiac: regular rate and rhythm, no M/R/G  Respiratory: CTAB, normal WOB  Abdomen: Soft, NTND, NABS, no rebound or guarding, no masses  Extremities: No C/C/E      Labs:    No results found for: WBC, RBC, HGB, HCT, MCV, MCHC, RDW, PLT, MPV    Lab Results   Component Value Date    NA 138 11/07/2016    NA 137 02/01/2015    K 4.7 11/07/2016    K 4.2 02/01/2015    CL 103 11/07/2016    CL 102 02/01/2015    BICARB 22 11/07/2016    BICARB 26 02/01/2015    BUN 21 11/07/2016    BUN 25 02/01/2015    CREAT 0.67 11/07/2016    CREAT 0.80 02/01/2015    GLU 81 11/07/2016    GLU 109 (H) 02/01/2015    [place] 9.8 11/07/2016    [place] 9.7 02/01/2015       No results found for: AST, ALT, GGT, LDH, ALK, TP, ALB, TBILI, DBILI    No results found for: INR, PTT    Lab Results   Component Value Date    A1C 6.2 (H) 11/05/2017    A1C 6.0 (H) 02/05/2017    A1C  5.5 04/10/2016    A1C 6.9 (H) 02/01/2015    A1C 7.3 (H) 11/02/2014    A1C 6.3 (H) 05/11/2014       Lab Results   Component Value Date    CHOL 190 02/05/2017    HDL 60 02/05/2017    TRIG 102 02/05/2017       Lab Results   Component Value Date    TSH 0.65 11/07/2016    TSH 29.94 (H) 08/28/2016    TSH 4.48 02/01/2015     EKG: NSR    Assessment and Plan:    Diagnoses and all orders for this visit:    Essential hypertension: Elevated today above goal <140/90. Compliant to all meds. Would continue current regimen and have patient check BPs at home until next visit prior to adding a 4th agent.    -Continue lisinopril 40/amlodipine 10/HCTZ 25  -BP log, if elevation continues can increase HCTZ vs. 4th agent    Chest pain in adult: Atypical and likely noncardiac although does have risk factors(age, diabetes, HTN). HEART score 3(low risk). Reassuring that patient has no chest pain while doing normal level of activity(>4 METS). Likely MSK given reproducibility and improvement with motrin. If continues can get stress test due to risk factors. ED precautions given.     -ED precautions given  -Motrin/tylenol prn for pain  -Consider stress test if continues to be an issue      Health Care Maintenance:  Vaccinations:   - QPY:PPJKD  - Tdap: unknown  - Shingrex:  >50: @/2 needs  - Pneumovax: >65 needs      Cancer screening:  Pap/pelvic: unknown  Colonoscopy: (every 10 years, 70-85) needs  Mammogram: (50-74, every 2 years) needs      Other screening:  ASCVD Risk: The 10-year ASCVD risk score Mikey Bussing DC Jr., et al., 2013) is: 15.6%    Values used to calculate the score:  Age: 81 years      Sex: Female      Is Non-Hispanic African American: No      Diabetic: Yes      Tobacco smoker: No      Systolic Blood Pressure: 859 mmHg      Is BP treated: Yes      HDL Cholesterol: 60 mg/dL      Total Cholesterol: 190 mg/dL   Low Risk: 0 - 5 %   Moderate Risk: 5 - 7.5 %   High Risk: 7.5 - 100 %                                                       Follow-up in 2 months    Patient discussed with attending physician Dr.Rodriguez    Consuela Mimes, MD  Internal Medicine Resident PGY3

## 2017-12-17 NOTE — Progress Notes (Signed)
See ECG in media.

## 2018-02-04 ENCOUNTER — Encounter: Payer: Self-pay | Admitting: Family Medicine

## 2018-02-04 NOTE — Progress Notes (Signed)
FOBT Results:    Colon cancer screen per Hemoccult test on 02/04/18.    - Patient completed 3 of 3 panels  - 3 panels were negative.  - 0 panels were positive.    Patient DOES NOT require referral to [facility].      Entered by Phoebe Catherine Stark, MS2 on 02/04/18.  Lab Manager, [facility]    CC: Natalie Rodriguez, MD

## 2018-02-18 ENCOUNTER — Ambulatory Visit: Payer: Self-pay | Admitting: Family Medicine

## 2018-02-18 VITALS — BP 152/71 | HR 86 | Temp 97.2°F | Resp 16 | Wt 186.0 lb

## 2018-02-18 DIAGNOSIS — M5430 Sciatica, unspecified side: Principal | ICD-10-CM

## 2018-02-18 DIAGNOSIS — E1169 Type 2 diabetes mellitus with other specified complication: Secondary | ICD-10-CM

## 2018-02-18 DIAGNOSIS — E785 Hyperlipidemia, unspecified: Secondary | ICD-10-CM

## 2018-02-18 DIAGNOSIS — I10 Essential (primary) hypertension: Secondary | ICD-10-CM

## 2018-02-18 DIAGNOSIS — K219 Gastro-esophageal reflux disease without esophagitis: Secondary | ICD-10-CM

## 2018-02-18 DIAGNOSIS — M545 Low back pain, unspecified: Secondary | ICD-10-CM

## 2018-02-18 DIAGNOSIS — M549 Dorsalgia, unspecified: Principal | ICD-10-CM

## 2018-02-18 DIAGNOSIS — E039 Hypothyroidism, unspecified: Secondary | ICD-10-CM

## 2018-02-18 DIAGNOSIS — E119 Type 2 diabetes mellitus without complications: Secondary | ICD-10-CM

## 2018-02-18 MED ORDER — LEVOTHYROXINE SODIUM 150 MCG OR TABS
150.0000 ug | ORAL_TABLET | Freq: Every day | ORAL | 0 refills | Status: DC
Start: 2018-02-18 — End: 2018-05-15

## 2018-02-18 MED ORDER — AMLODIPINE 10 MG OR TABS
10.0000 mg | ORAL_TABLET | Freq: Every day | ORAL | 0 refills | Status: DC
Start: 2018-02-18 — End: 2018-05-15

## 2018-02-18 MED ORDER — HYDROCHLOROTHIAZIDE 25 MG OR TABS
25.0000 mg | ORAL_TABLET | Freq: Every day | ORAL | 0 refills | Status: DC
Start: 2018-02-18 — End: 2018-05-15

## 2018-02-18 MED ORDER — ASPIRIN EC 81 MG OR TBEC (CUSTOM)
81.0000 mg | DELAYED_RELEASE_TABLET | Freq: Every day | ORAL | 0 refills | Status: DC
Start: 2018-02-18 — End: 2018-05-15

## 2018-02-18 MED ORDER — LISINOPRIL 40 MG OR TABS
40.0000 mg | ORAL_TABLET | Freq: Every day | ORAL | 0 refills | Status: DC
Start: 2018-02-18 — End: 2018-05-15

## 2018-02-18 MED ORDER — IBUPROFEN 200 MG OR TABS
400.0000 mg | ORAL_TABLET | Freq: Every evening | ORAL | 0 refills | Status: DC
Start: 2018-02-18 — End: 2018-05-15

## 2018-02-18 MED ORDER — FAMOTIDINE 20 MG OR TABS
20.0000 mg | ORAL_TABLET | Freq: Every day | ORAL | 0 refills | Status: DC | PRN
Start: 2018-02-18 — End: 2018-05-15

## 2018-02-18 MED ORDER — GABAPENTIN 300 MG OR CAPS
300.0000 mg | ORAL_CAPSULE | Freq: Two times a day (BID) | ORAL | 0 refills | Status: DC
Start: 2018-02-18 — End: 2018-05-15

## 2018-02-18 MED ORDER — ATORVASTATIN CALCIUM 40 MG OR TABS
40.0000 mg | ORAL_TABLET | Freq: Every day | ORAL | 0 refills | Status: DC
Start: 2018-02-18 — End: 2018-05-15

## 2018-02-18 NOTE — Progress Notes (Signed)
[facility] Progress Note    Student(s): Clydene Pugh, MS4 and Ricky Stabs, MS1    Attending: Willeen Cass, MD    SUBJECTIVE:  Felicia [name] is a 63 year old female with a PMH of T2DM, HTN, HLD, hypothyroidism, and gastritis(?) who presents today for follow-up of T2DM, HTN, HLD, hypothyroidism, gastritis(?), chest pain; and medication refill.   No chief complaint on file.    # Chest pain  Resolution of sxs, denies SOB, dyspnea, CP.    # Hypertension  BP readings have been elevated a few times since 10/2017. Pt brought in BP log (home measurements taken before bed, BP range: 119-150s/50-70s).   1/18: 115/52  1/21: 150/69  1/22: 148/69  Pt denies any HA or dizziness. Reports taking all of her medications. Denies adding much salt to her home meals or drinking caffeine.     # T2DM  Pt is not taking Metformin since summer 2019 as it was recommended for her to discontinue. Denies increased thirst, polyuria, vision changes, or any numbness/tingling/burning in her peripheral extremities. She does report nocturia (2x/night), but this is chronic/normal for her.   Her typical diet consists of:  Breakfast: eggs (fried in coconut oil), beans, potatoes  Lunch: rice, potatoes, mildly spicy salsa  Dinner: cheese quesadilla, corn tortillas  Cinnamon/chamomile tea in the evening with honey. Up to 4 tortillas/day. Reports cooking most meals herself and does not add much salt. Reportedly has decreased soda and carbs, no coffee or alcohol.   Exercise: Reports walking 1hr, 4x/week. Notes tachycardia and sweating with walking.      # Hypothyroidism  Taking Levothyroxine, with no issues. Denies any change in weight gain/loss, palpitations, cold or hot temperature intolerance.     # GERD vs gastritis   Reports chronic epigastric pain, with simultaneous back pain that is relieved with Ranitidine. Taking Ranitidine prn (~3x/week). Also reports feeling "bloated" to the point that sometimes her pants don't fit.  Chicken soup seems to be a trigger when she adds tomatoes. Reports burping. Denies N/V/D, constipation, or acid reflux (no exacerbation with laying supine, no exacerbation with spicy foods). Was told in the past that she had gastritis.      # Sciatica  Chronic duration of 3-4 years, occurring almost every day (worse in the last 6 months). Pain starts in the lower mid back, radiating to the right leg and, recently to the left. Takes Ibuprofen 200mg , BID, prn, which she approximates to ~3x/week. Reports trying acupuncture a long time ago with no success; reports some hesitation with trying again, due to discomfort the following day, but willing to try again to see if this helps her sciatica.     # Social, other   Denies alcohol use or caffeine intake.     Current Outpatient Medications   Medication Sig    amLODIPINE (NORVASC) 10 MG tablet Take 1 tablet (10 mg) by mouth daily.    aspirin 81 MG EC tablet Take 1 tablet (81 mg) by mouth daily.    atorvastatin (LIPITOR) 40 MG tablet Take 1 tablet (40 mg) by mouth daily.    famotidine (PEPCID) 20 MG tablet Take 1 tablet (20 mg) by mouth daily as needed (gerd).    gabapentin (NEURONTIN) 300 MG capsule Take 1 capsule (300 mg) by mouth 2 times daily.    hydrochlorothiazide (HYDRODIURIL) 25 MG tablet Take 1 tablet (25 mg) by mouth daily.    ibuprofen (MOTRIN) 200 MG tablet Take 2 tablets (400 mg) by mouth  at bedtime. Take with food.    levothyroxine (SYNTHROID) 150 MCG tablet Take 1 tablet (150 mcg) by mouth every morning (before breakfast).    lisinopril (PRINIVIL, ZESTRIL) 40 MG tablet Take 1 tablet (40 mg) by mouth daily.     No current facility-administered medications for this visit.      Review of Systems:  Review of Systems - Constitutional: negative for: weight loss, weight gain.  Eyes: negative for:  blurry vision.  Ears, Nose, Mouth, Throat: negative.  CV: negative for:  palpitations, syncope, chest pain.  Resp: negative for:  shortness of breath.  GI:  negative for: vomiting, nausea, heartburn or reflux, constipation, diarrhea.  GU: negative for: +bloating, +epigastric pain, +belching, dysuria, frequency, urgency.  Musculoskeletal: muscle pain.  Neuro: negative for: headaches, feel faint, paralysis/weakness, numbness/tingling    OBJECTIVE:  BP 152/71 (BP Location: Left arm, BP Patient Position: Sitting, BP cuff size: Regular)    Pulse 86    Temp 97.2 F (36.2 C) (Oral)    Resp 16    Wt 84.4 kg (186 lb)    SpO2 97%   Gen: older female, NAE, pleasant, cooperative  HEENT: pupils equal and reactive to light bilaterally, no nasal discharge, moist oropharynx, no uvula deviation  Lungs: CTAB, no wheezing/rhonchi/crackles  CV: RRR, normal S1/S2, no murmurs/gallops/rubs  Abd: normal active bowel sounds, non-tender, non-distended  Ext: mild nonpitting edema, 2+/1+ left/right DP pulses    Labs:  No results found for: WBC, RBC, HGB, HCT, MCV, MCHC, RDW, PLT, MPV  Lab Results   Component Value Date    NA 138 11/07/2016    NA 137 02/01/2015    K 4.7 11/07/2016    K 4.2 02/01/2015    CL 103 11/07/2016    CL 102 02/01/2015    BICARB 22 11/07/2016    BICARB 26 02/01/2015    BUN 21 11/07/2016    BUN 25 02/01/2015    CREAT 0.67 11/07/2016    CREAT 0.80 02/01/2015    GLU 81 11/07/2016    GLU 109 (H) 02/01/2015    [place] 9.8 11/07/2016    [place] 9.7 02/01/2015     Lab Results   Component Value Date    A1C 6.2 (H) 11/05/2017    A1C 6.9 (H) 02/01/2015     FM PHQ2 08/27/2017 03/19/2017 07/03/2016 07/19/2015 04/26/2015   PHQ2 Total 3 2 0 0 0   Some recent data might be hidden       Assessment and Plan:  Felicia [name] is a 63 year old female with a PMH of T2DM, HTN, HLD, hypothyroidism, and gastritis(?) who presents today for follow-up of T2DM, HTN, HLD, hypothyroidism, gastritis(?), chest pain; and medication refill.    # Chest pain like 2/2 to MSK etiology  Sxs resolved. Denies CP, SOB, or dyspnea.     # Hypertension  Today's reading is 152/72. Repeat later in the visit was 152/75. Last visit  11/2017: 149/65.  Home BP readings: range 119-150s/50-70s; last 2 home readings were 150/69 and 148/69.   Pt is not at goal of <140/90. We discussed with the pt dietary changes vs changing to a higher medication dose. Pt prefers dietary changes for the next 3 months to see if this makes a difference. We asked her to keep a BP log to be assessed at next visit. Consider switching from Lisinopril 40mg  to Losartan 100mg  if BPs continue to be elevated.   -Continue Amlodipine 10mg  po daily, HCTZ 25mg  po daily, Lisinopril 40mg   po daily  -Dietary modifications: decrease salt consumption, decrease carb consumption (pt to decrease all carb intake by half per meal)  -Pt to bring in BP log (check once daily before lunch).    # T2DM  Appears to be well-controlled, last A1c on 11/05/17 was 6.2. No concerns for new symptoms. Will check a new A1c to ensure within control as well as kidney function. Will refer to optho clinic given that her last reported exam was ~1.5 years ago. Pt stopped Metformin (per attending physician) in summer 2019.   -Check Hgb A1c, CMP and lipid panel.  -Continue Lipitor 40mg  po daily.  -Referral to optho clinic.   -Continue diet and exercise modifications.     # Hypothyroidism  Stable. Last TSH on 10/2016 was 0.65. Will recheck TSH.   -Check TSH.   -Continue Levothyroxine po daily.    # GERD vs gastritis  Unclear if this is GERD or gastritis. Sxs are atypical for GERD, as she denies acid reflux or usual triggers. Has epigastric pain with simultaneous back pain that occurs ~3x/week and is reportedly relieved with Ranitidine. Will ask pt to stop NSAID use, as this may contribute to gastritis/GERD. Pt to try GAS-X OTC if symptoms persist. Re-evaluate sxs at next visit.   -Switch to Fomotidine 20mg  po daily.   -If sxs persist, take GAS-X OTC.     # Sciatica, bilateral lower extremities  Chronic bilateral sciatica, worsening in the last 6 months. We will stop NSAID use and switch to Gabapentin, however,  NSAIDs were dispensed in the case that Gabapentin does not work for her. Will refer to both acupuncture and PT.   -Referral to acupuncture and PT.   -Stop NSAIDs (only use if needed).  -Start Gabapentin 300mg  po BID.      Health Care Maintenance: biannual dental cleanings (scheduled for 03/03/2018), annal eye exam (due, referred)  Contraception: postmenopausal  Immunizations: flu vaccine (UTD 12/17/17), pneumovax (PPSV23 due 2021, last in 2016), Td (administered 02/18/2018)  Cancer Screening: colonoscopy, PAP smear (UTD, 2019, normal), mammogram (UTD, 11/2017, normal)    Patient to RTC on: May 20, 2018    Medications dispensed:  Famotidine 20 mg PO, 60 tablets  Ibuprofen 200  PO, 120 tablets  Amlodipine 10 mg PO, 90 tablets  Aspirin 81 mg PO, 90 tablets  HCTZ 25 mg PO, 90 tablets  Gabapentin 300 mg PO, 180 tablets  Lisinopril 40 mg PO, 90 tablets  Levothyroxine 150 mcg PO, 90 tablets  Atorvastatin 40 mg PO, 90 tablets    To do items at next visit:  1) F/u HTN, HTN log, diet changes (decrease carb and salt consumption)  2) F/u lab results (Hgb A1c, CMP, TSH, lipid panel)  3) F/u ophtho, acupuncture, and PT consults  4) F/u T2DM, hypothyroidism, sciatica, gastritis(?)  5) Medication refill    Patient seen, discussed and examined with attending physician, Dr. Bland Span.    Clydene Pugh, MS4 and Ricky Stabs, MS1  [facility]

## 2018-02-20 NOTE — Progress Notes (Signed)
ADDENDUM:  Felicia [name] is a 63 year old female with a PMH of T2DM, HTN, HLD, hypothyroidism, and gastritis(?) who presents today for follow-up of T2DM, HTN, HLD, hypothyroidism, reported gastritis, chest pain; and medication refill.    Discussed and evaluated this patient with medical students as described in th MS4 note.  Blood pressure only recently elevated, which is concomitant with weight gain after recent weight loss 1 focusing diet.  She is on 3 antihypertensive medications with room to increase, however will defer changes at this time however to focus on reducing salt, change in diet, exercise.    Epigastric pain that is intermittent, described as bloating but improves with ranitidine.  She has a reported history of gastritis and states the pain does not migrate.  She also denies sub sternal burning and early satiety.  Possible NSAID related but her NSAID use is fairly limited.  Possible H pylori related gastritis given bloating.  Possible gassy is distension. Recently described chest pain that has since resolved and she does walk an hour daily with no angina, diabetes well controlled, therefore less likely cardiac related. Recommended she not use NSAIDs, trial Gas-X.  Consider H pylori stool antigen at future visit if pain continues.    Bilateral radicular pain that is chronic but improves with low-dose ibuprofen.  No red flag symptoms and pain is generally mild, bilateral.  Likely musculoskeletal pain.  Less likely compression fracture.  She is being switched to gabapentin and referred to physical therapy and acupuncture for continued conservative treatment.    Diabetes previously well controlled with diet.  Repeat A1c today.    Agree with assessment and plan as detailed a medical student note above.    Patient discussed with attending physician Dr. Sherlon Handing.    Willeen Cass, MD, MBA  PGY3 Department of [facility]  Pager [phone]

## 2018-03-18 ENCOUNTER — Ambulatory Visit: Payer: Self-pay | Admitting: Ophthalmology

## 2018-03-18 VITALS — BP 143/67 | HR 90 | Temp 97.1°F | Resp 18 | Wt 184.0 lb

## 2018-03-18 DIAGNOSIS — Z135 Encounter for screening for eye and ear disorders: Secondary | ICD-10-CM

## 2018-03-18 DIAGNOSIS — E119 Type 2 diabetes mellitus without complications: Secondary | ICD-10-CM

## 2018-03-18 NOTE — Progress Notes (Addendum)
[facility] Specialty Clinic Note  Felicia [name]  08/02/1955  [id number]  03/18/18      Reason for Referral:    SUBJECTIVE: Felicia [name] is a 63 year old female with h/o T2DM here for diabetes retinopathy screening.     Past Medical History  Past Medical History:   Diagnosis Date   . Back pain at L4-L5 level 11/2014   . Diabetes mellitus type 2, controlled, without complications (CMS-HCC)    . GERD (gastroesophageal reflux disease)    . Hyperlipidemia associated with type 2 diabetes mellitus (CMS-HCC)    . Hypertension    . Hypothyroidism    . Obesity        Medications/Allergies:     Current Outpatient Medications   Medication Sig   . amLODIPINE (NORVASC) 10 MG tablet Take 1 tablet (10 mg) by mouth daily.   Marland Kitchen aspirin 81 MG EC tablet Take 1 tablet (81 mg) by mouth daily.   Marland Kitchen atorvastatin (LIPITOR) 40 MG tablet Take 1 tablet (40 mg) by mouth daily.   . famotidine (PEPCID) 20 MG tablet Take 1 tablet (20 mg) by mouth daily as needed (gerd).   . gabapentin (NEURONTIN) 300 MG capsule Take 1 capsule (300 mg) by mouth 2 times daily.   . hydrochlorothiazide (HYDRODIURIL) 25 MG tablet Take 1 tablet (25 mg) by mouth daily.   Marland Kitchen ibuprofen (MOTRIN) 200 MG tablet Take 2 tablets (400 mg) by mouth at bedtime. Take with food.   Marland Kitchen levothyroxine (SYNTHROID) 150 MCG tablet Take 1 tablet (150 mcg) by mouth every morning (before breakfast).   Marland Kitchen lisinopril (PRINIVIL, ZESTRIL) 40 MG tablet Take 1 tablet (40 mg) by mouth daily.     No current facility-administered medications for this visit.        No Known Allergies    Family History   Problem Relation Name Age of Onset   . Cancer Unknown     . Diabetes Unknown     . Hypertension Mother           Exam    Vision          Distance:  OD: 20/25 -1 PH 20/25  OS: 20/25 PHNI     Pupillary Reflex: PERRL    CVF: Normal  APD: Normal      IOP (tono)  OD: 13  OS: 13    Conjunctiva/scelra: PTG OD    Dilated Fundus Exam  Disc:  pink/sharp/flat OU, no neovascularization (C/D  0.3 OU)  Macula : flat, no exudates/hemorrhage or edema OU   Vessels: within normal limits OU; no DR OU  Periphery: within normal limits OU, no retinal tear or detachment visualized            Assessment/Plan:  Felicia [name] is a 63 year old female with T2DM w/o BDR. Recommend blood pressure and blood glucose control. Recommend annual dilated fundus exam.         Attending: Westley Chandler, MD    Note written by medical student, Ardelle Anton, MS1    I examined the patient and agree with the above assessment and plan.

## 2018-04-08 ENCOUNTER — Encounter: Payer: Self-pay | Admitting: Ophthalmology

## 2018-04-08 ENCOUNTER — Ambulatory Visit: Payer: Self-pay | Admitting: Ophthalmology

## 2018-04-08 DIAGNOSIS — Z794 Long term (current) use of insulin: Principal | ICD-10-CM

## 2018-04-08 DIAGNOSIS — E119 Type 2 diabetes mellitus without complications: Principal | ICD-10-CM

## 2018-04-08 NOTE — Progress Notes (Signed)
[facility] Specialty Clinic Note  Felicia [name]  03-Jan-1956  [id number]  04/08/18      Reason for Referral:    SUBJECTIVE::Felicia [name] is a 63 year old female with h/o DM here for cataract evaluation.    Past Medical History  Past Medical History:   Diagnosis Date   . Back pain at L4-L5 level 11/2014   . Diabetes mellitus type 2, controlled, without complications (CMS-HCC)    . GERD (gastroesophageal reflux disease)    . Hyperlipidemia associated with type 2 diabetes mellitus (CMS-HCC)    . Hypertension    . Hypothyroidism    . Obesity        Medications/Allergies:     Current Outpatient Medications   Medication Sig   . amLODIPINE (NORVASC) 10 MG tablet Take 1 tablet (10 mg) by mouth daily.   Marland Kitchen aspirin 81 MG EC tablet Take 1 tablet (81 mg) by mouth daily.   Marland Kitchen atorvastatin (LIPITOR) 40 MG tablet Take 1 tablet (40 mg) by mouth daily.   . famotidine (PEPCID) 20 MG tablet Take 1 tablet (20 mg) by mouth daily as needed (gerd).   . gabapentin (NEURONTIN) 300 MG capsule Take 1 capsule (300 mg) by mouth 2 times daily.   . hydrochlorothiazide (HYDRODIURIL) 25 MG tablet Take 1 tablet (25 mg) by mouth daily.   Marland Kitchen ibuprofen (MOTRIN) 200 MG tablet Take 2 tablets (400 mg) by mouth at bedtime. Take with food.   Marland Kitchen levothyroxine (SYNTHROID) 150 MCG tablet Take 1 tablet (150 mcg) by mouth every morning (before breakfast).   Marland Kitchen lisinopril (PRINIVIL, ZESTRIL) 40 MG tablet Take 1 tablet (40 mg) by mouth daily.     No current facility-administered medications for this visit.        No Known Allergies    Family History   Problem Relation Name Age of Onset   . Cancer Other     . Diabetes Other     . 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 Continue with surveillance. She also has T2DM w/o BDR, rec BP/BG control and q1-40yr DFE.      Attending: Westley Chandler, MD  Note written by medical student, Ardelle Anton, MS1

## 2018-04-08 NOTE — Progress Notes (Deleted)
Felicia [name]  Felicia [name]  01-03-1956  [id number]  04/08/18      Reason for Referral:    SUBJECTIVE::Felicia [name] is a 63 year old female with h/o *** here for ***    Past Medical History  Past Medical History:   Diagnosis Date   . Back pain at L4-L5 level 11/2014   . Diabetes mellitus type 2, controlled, without complications (CMS-HCC)    . GERD (gastroesophageal reflux disease)    . Hyperlipidemia associated with type 2 diabetes mellitus (CMS-HCC)    . Hypertension    . Hypothyroidism    . Obesity        Medications/Allergies:     Current Outpatient Medications   Medication Sig   . amLODIPINE (NORVASC) 10 MG tablet Take 1 tablet (10 mg) by mouth daily.   Marland Kitchen aspirin 81 MG EC tablet Take 1 tablet (81 mg) by mouth daily.   Marland Kitchen atorvastatin (LIPITOR) 40 MG tablet Take 1 tablet (40 mg) by mouth daily.   . famotidine (PEPCID) 20 MG tablet Take 1 tablet (20 mg) by mouth daily as needed (gerd).   . gabapentin (NEURONTIN) 300 MG capsule Take 1 capsule (300 mg) by mouth 2 times daily.   . hydrochlorothiazide (HYDRODIURIL) 25 MG tablet Take 1 tablet (25 mg) by mouth daily.   Marland Kitchen ibuprofen (MOTRIN) 200 MG tablet Take 2 tablets (400 mg) by mouth at bedtime. Take with food.   Marland Kitchen levothyroxine (SYNTHROID) 150 MCG tablet Take 1 tablet (150 mcg) by mouth every morning (before breakfast).   Marland Kitchen lisinopril (PRINIVIL, ZESTRIL) 40 MG tablet Take 1 tablet (40 mg) by mouth daily.     No current facility-administered medications for this visit.        No Known Allergies    Family History   Problem Relation Name Age of Onset   . Cancer Other     . Diabetes Other     . Hypertension Mother           Exam  EDIT ALL *** EXAM INFO    Vision          Distance: OD   Cc/sc***  OS     Near:  OD   Cc/sc***  OS     Pupillary Reflex: PERRL***    EOM: EOMI***  CVF: Normal***    IOP (tono)  OD   OS     Slit Lamp ***  LL: wnl OU  CS: white and quiet OU  K: clear OU  AC: deep and quiet OU  Iris: round and  dilated OU, no NVI OU  Lens: wnl OU    Dilated Fundus Exam ***  Disc:  pink/sharp/flat OU, no neovascularization     Macula : flat, no exudates/hemorrhage or edema OU   Vessels: within normal limits OU   Periphery: within normal limits OU, no retinal tear or detachment visualized            Assessment/Plan:        Attending: ***, MD  Resident: ***, MD  [name] written by medical student, ***, MS***

## 2018-05-15 NOTE — Progress Notes (Signed)
Called patient to prepare for visit on Weds 4/22    Obtained verbal consent for patient to have a telemedicine visit with phone or video.    Confirmed patient's phone # and address in EPIC.    Assessed patients access to technology:  Smartphone: yes  Email: no  Computer with video/audio:  Reliable Internet:   Unlimited Data plan:    Assessed patients access to transportation  Car/ride:  car  Bus:     Delivery of meds/food     Pt requested the following medications:  all    Pt instructed to be available at 5 on Weds  for a phone visit.

## 2018-05-20 ENCOUNTER — Ambulatory Visit: Payer: Self-pay | Admitting: Hospitalist

## 2018-05-20 DIAGNOSIS — I10 Essential (primary) hypertension: Secondary | ICD-10-CM

## 2018-05-20 DIAGNOSIS — M549 Dorsalgia, unspecified: Secondary | ICD-10-CM

## 2018-05-20 DIAGNOSIS — M545 Low back pain, unspecified: Secondary | ICD-10-CM

## 2018-05-20 DIAGNOSIS — E119 Type 2 diabetes mellitus without complications: Secondary | ICD-10-CM

## 2018-05-20 DIAGNOSIS — K219 Gastro-esophageal reflux disease without esophagitis: Secondary | ICD-10-CM

## 2018-05-20 DIAGNOSIS — E785 Hyperlipidemia, unspecified: Secondary | ICD-10-CM

## 2018-05-20 DIAGNOSIS — E039 Hypothyroidism, unspecified: Secondary | ICD-10-CM

## 2018-05-20 MED ORDER — LEVOTHYROXINE SODIUM 150 MCG OR TABS
150.0000 ug | ORAL_TABLET | Freq: Every day | ORAL | 0 refills | Status: DC
Start: 2018-05-20 — End: 2018-08-12

## 2018-05-20 MED ORDER — FAMOTIDINE 20 MG OR TABS
20.0000 mg | ORAL_TABLET | Freq: Every day | ORAL | 0 refills | Status: DC | PRN
Start: 2018-05-20 — End: 2018-08-12

## 2018-05-20 MED ORDER — ASPIRIN EC 81 MG OR TBEC (CUSTOM)
81.0000 mg | DELAYED_RELEASE_TABLET | Freq: Every day | ORAL | 0 refills | Status: DC
Start: 2018-05-20 — End: 2018-08-12

## 2018-05-20 MED ORDER — GABAPENTIN 300 MG OR CAPS
300.0000 mg | ORAL_CAPSULE | Freq: Two times a day (BID) | ORAL | 0 refills | Status: DC
Start: 2018-05-20 — End: 2018-08-12

## 2018-05-20 MED ORDER — LISINOPRIL 40 MG OR TABS
40.0000 mg | ORAL_TABLET | Freq: Every day | ORAL | 0 refills | Status: DC
Start: 2018-05-20 — End: 2018-08-12

## 2018-05-20 MED ORDER — AMLODIPINE 10 MG OR TABS
10.0000 mg | ORAL_TABLET | Freq: Every day | ORAL | 0 refills | Status: DC
Start: 2018-05-20 — End: 2018-08-12

## 2018-05-20 MED ORDER — HYDROCHLOROTHIAZIDE 25 MG OR TABS
25.0000 mg | ORAL_TABLET | Freq: Every day | ORAL | 0 refills | Status: DC
Start: 2018-05-20 — End: 2018-08-12

## 2018-05-20 MED ORDER — ATORVASTATIN CALCIUM 40 MG OR TABS
40.0000 mg | ORAL_TABLET | Freq: Every day | ORAL | 0 refills | Status: DC
Start: 2018-05-20 — End: 2018-08-12

## 2018-05-20 MED ORDER — IBUPROFEN 200 MG OR TABS
400.0000 mg | ORAL_TABLET | Freq: Every evening | ORAL | 0 refills | Status: DC
Start: 2018-05-20 — End: 2018-08-12

## 2018-05-20 NOTE — Progress Notes (Signed)
[facility] [facility] Telemedicine Progress Note     Due to COVID-19 pandemic and a federally declared state of public health  emergency, this service is being conducted via telephone or video.     Patient has multiple co-morbidities that pose  substantial risk if exposed to COVID-19.     Verbal consent obtained for telephone/video visit.    SUBJECTIVE:    Felicia [name] is a 63 year old female with   Chief Complaint   Patient presents with   . Medication Follow-up     F/U for HTN, T2DM, hypothyroidism, sciatica       [facility]    To Do items from last visit were:  - Continue surveillance of cataract OU NVS.    HPI    Felicia [name] is a 63 year old female with a past medical hx of T2DM (last A1c 6.2% Oct 2019), HTN, HLD, hypothyroidism, and sciatica who presents today for F/U for her chronic conditions and medication refill.    #Hypertension  - Patient has not checked blood pressure at home recently, but reports no new symptoms of hypertensive states.  - Patient reports mild light-headedness when bending over to grab something from floor or when getting up too fast from a lying position.  - Patient denies any headaches, vertigo, syncope, or unsteadiness.    #T2DM  - Last hemoglobin A1c in 10/2017 (6.2%)  - Patient reports normal thirst level.  - Patient denies polyuria, nocturia, numbness/tingling in extremities  - Diet:   - Breakfast (green smoothie, bread)   - Lunch (rice with beans or lentils)   - Dinner: apples or fruits  - Exercise: patient goes on occasional walks, but unable to exercise due to shelter in place restrictions. Patient reports sewing and chores around the house as physical activities.     #Hypothyroidsm  - Patient continues to take levothyroxine.  - Patient denies weight changes, heart palpitations, cold/heat tolerance    #Sciatica  - Patient reports chronic back pain from prolonged sitting; however, pain does not radiate to other  regions.  - Patient reports gabapentin and NSAID help alleviate pain.  - Patient takes ibuprofen twice a week as needed for the back pain.     #Social Hx, Family Hx  - Patient denies alcohol, smoking, or drug use  - Patient reports family stays home as much as possible (two sons and husband only go out to work)  - Patient states there is enough food at home and husband is in charge of going out for groceries    #Review of Systems  - General: denies weight changes, weakness, fevers, chills, nausea, vomiting  - HEENT: denies vision changes, masses in throat, sore throat, headaches  - Pulmonary: denies coughing, wheezing, or shortness of breath  - CV: denies chest pain, heart palpitations, syncope  - GI: patient reports + constipation; denies abdominal pain, diarrhea, bloating  - GU: denies polyuria, nocturia, dysuria   - Dermatological: denies skin changes  - MSK: patient reports + back pain; denies other muscle or bone pain  - Neuro: denies headaches, numbness/tingling in extremities   - Psych: denies mood changes    Needs assessment for patient health during Covid 19 pandemic  Technology Access:  Smart phone access: Yes  Reliable Internet access:  Yes  Video call access (zoom or my chart): No  [facility] No (only phone visit)  Transportation  access:  Access to a car or ride to pick up medications or food:  Needs medications delivered  Covid 19 related issues:   High Risk for Covid 64 (age 3, DM, HTN, pulmonary co-morbidities, smoking, homeless): Yes (HTN, DM)  How is the patient protecting themselves from covid 30 (e.g. Is patient "sheltering in place", practicing "social distancing"): Patient is sheltering in place and accesses news for updates about COVID-19.  Any sick contacts or known covid-19 exposures: No  Does the patient have any questions about how to protect him/her self from covid-19? No questions.  Any concerns about emotional well being? Patient reports she and  her family is safe.  Is there enough food in the house? Patient reports husband buys groceries when needed.   Does the patient know how to stay active during this time (handouts in Spanish available at [email or web address].pdf)? Patient does occasional walks and chores for physical activity.         Current Outpatient Medications   Medication Sig   . amLODIPINE (NORVASC) 10 MG tablet Take 1 tablet (10 mg) by mouth daily.   Marland Kitchen aspirin 81 MG EC tablet Take 1 tablet (81 mg) by mouth daily.   Marland Kitchen atorvastatin (LIPITOR) 40 MG tablet Take 1 tablet (40 mg) by mouth daily.   . famotidine (PEPCID) 20 MG tablet Take 1 tablet (20 mg) by mouth daily as needed (gerd).   . gabapentin (NEURONTIN) 300 MG capsule Take 1 capsule (300 mg) by mouth 2 times daily.   . hydrochlorothiazide (HYDRODIURIL) 25 MG tablet Take 1 tablet (25 mg) by mouth daily.   Marland Kitchen ibuprofen (MOTRIN) 200 MG tablet Take 2 tablets (400 mg) by mouth at bedtime. Take with food.   Marland Kitchen levothyroxine (SYNTHROID) 150 MCG tablet Take 1 tablet (150 mcg) by mouth every morning (before breakfast).   Marland Kitchen lisinopril (PRINIVIL, ZESTRIL) 40 MG tablet Take 1 tablet (40 mg) by mouth daily.     No current facility-administered medications for this visit.        OBJECTIVE:  Physical Exam and VS deferred because telemedicine visit    Labs: No new labs for this 05/20/18 visit.  Lab Results   Component Value Date    A1C 6.2 (H) 11/05/2017    A1C 6.9 (H) 02/01/2015     Lab Results   Component Value Date    NA 138 11/07/2016    NA 137 02/01/2015    K 4.7 11/07/2016    K 4.2 02/01/2015    CL 103 11/07/2016    CL 102 02/01/2015    BICARB 22 11/07/2016    BICARB 26 02/01/2015    BUN 21 11/07/2016    BUN 25 02/01/2015    CREAT 0.67 11/07/2016    CREAT 0.80 02/01/2015    GLU 81 11/07/2016    GLU 109 (H) 02/01/2015    [place] 9.8 11/07/2016    [place] 9.7 02/01/2015       Studies/Imaging: N/A    Assessment and  Plan:    Felicia [name] is a 63 year old female with   Chief Complaint   Patient presents with   . Medication Follow-up     F/U for HTN, T2DM, hypothyroidism, sciatica     Ms. Tasia Frantom Bartholf is a 63 year old female with a past medical hx of T2DM (last A1c 6.2% Oct 2019), HTN, HLD, hypothyroidism, and sciatica who presents today for F/U for her chronic conditions and medication refill.    #HTN  - Continue  to check blood pressure at home.  - Continue current medication regimen.  - Increase water intake to avoid orthostatic hypotension by drinking more water at the start of the day. Avoid drinking water right before bed to prevent nocturia.    #T2DM  - Follow up in three months with new hemoglobin A1c value to monitor progress.   - Continue healthy diet and exercise plan.     #Hypothyroidism  - Continue levothyroxine.   - Follow up in three months with new TSH level to monitor progess.     #Sciatica  - Continue gabapentin and NSAIDs for pain alleviation in lower back.  - Reduce time of prolonged sitting to alleviate pain.     120 minutes were spent with patient on the phone and additional time was spent coordinating labs, medications and follow up.    Next appointment: 3 months.     To do items at next visit:  - Check new lab results (hemoglobin A1c, CMP, TSH, CBC) and change medication regimen as needed.   - Encourage patient to maintain diet and exercise to keep chronic conditions stable.   - Medication refill     Please review the information below IF the patient will be getting food or medications via home delivery.  NOTE: f the patient is asking for home delivery of food or medications, please confirm the home address and message floor managers if it needs to be updated in Epic.  The patient needs to be present for deliveries so confirm they will be available (Tues PM for DT/NH and Thurs PM for PB).  The patient will receive a call when the person doing the delivery is near the home.  The patient should  meet the person doing the delivery outside the home and maintain safe social distancing.  The person doing the delivery will be wearing a mask and will maintain safe social distancing.    - Patient confirmed availability for Thurs PM medication delivery at home addressed stated in chart with current phone number being correct.     Gloriajean Dell, MS1  [facility]     Patient was reviewed and discussed over telephone visit with attending physician, Dr. Don Broach.

## 2018-08-06 NOTE — Progress Notes (Signed)
Called patient to prepare for visit on 08/11/18.    Obtained verbal consent for patient to have a telemedicine visit with phone or video.    Confirmed patient's phone # and address in EPIC.    Assessed patients access to technology:  Email: none  Smartphone:  Computer with video/audio:  Reliable Internet:   Unlimited Data plan:      Assessed patients access to transportation  Car/ride: yes  Bus:     Food/meds dropped off at patients home.    Pt requested the following medications:  Needs all meds     Pt instructed to be available at 5 pm  on Wed for a phone/video visit. Patient has agreed to phone.

## 2018-08-12 ENCOUNTER — Other Ambulatory Visit: Payer: Self-pay | Admitting: Family Medicine

## 2018-08-12 ENCOUNTER — Ambulatory Visit: Payer: Self-pay | Admitting: Family Medicine

## 2018-08-12 ENCOUNTER — Encounter: Payer: Self-pay | Admitting: Family Medicine

## 2018-08-12 DIAGNOSIS — L309 Dermatitis, unspecified: Secondary | ICD-10-CM

## 2018-08-12 DIAGNOSIS — I10 Essential (primary) hypertension: Secondary | ICD-10-CM

## 2018-08-12 DIAGNOSIS — M549 Dorsalgia, unspecified: Secondary | ICD-10-CM

## 2018-08-12 DIAGNOSIS — E119 Type 2 diabetes mellitus without complications: Secondary | ICD-10-CM

## 2018-08-12 DIAGNOSIS — M545 Low back pain, unspecified: Secondary | ICD-10-CM

## 2018-08-12 DIAGNOSIS — K219 Gastro-esophageal reflux disease without esophagitis: Secondary | ICD-10-CM

## 2018-08-12 DIAGNOSIS — E1169 Type 2 diabetes mellitus with other specified complication: Secondary | ICD-10-CM

## 2018-08-12 DIAGNOSIS — E039 Hypothyroidism, unspecified: Secondary | ICD-10-CM

## 2018-08-12 MED ORDER — LEVOTHYROXINE SODIUM 150 MCG OR TABS
150.0000 ug | ORAL_TABLET | Freq: Every day | ORAL | 0 refills | Status: DC
Start: 2018-08-12 — End: 2018-11-04

## 2018-08-12 MED ORDER — FAMOTIDINE 20 MG OR TABS
20.0000 mg | ORAL_TABLET | Freq: Every day | ORAL | 0 refills | Status: DC | PRN
Start: 2018-08-12 — End: 2018-11-04

## 2018-08-12 MED ORDER — LISINOPRIL 40 MG OR TABS
40.0000 mg | ORAL_TABLET | Freq: Every day | ORAL | 0 refills | Status: DC
Start: 2018-08-12 — End: 2018-11-04

## 2018-08-12 MED ORDER — GABAPENTIN 300 MG OR CAPS
300.0000 mg | ORAL_CAPSULE | Freq: Two times a day (BID) | ORAL | 0 refills | Status: DC
Start: 2018-08-12 — End: 2018-11-04

## 2018-08-12 MED ORDER — ATORVASTATIN CALCIUM 40 MG OR TABS
40.0000 mg | ORAL_TABLET | Freq: Every day | ORAL | 0 refills | Status: DC
Start: 2018-08-12 — End: 2018-11-04

## 2018-08-12 MED ORDER — IBUPROFEN 200 MG OR TABS
400.0000 mg | ORAL_TABLET | Freq: Every evening | ORAL | 0 refills | Status: DC
Start: 2018-08-12 — End: 2018-11-04

## 2018-08-12 MED ORDER — ASPIRIN EC 81 MG OR TBEC (CUSTOM)
81.0000 mg | DELAYED_RELEASE_TABLET | Freq: Every day | ORAL | 0 refills | Status: DC
Start: 2018-08-12 — End: 2018-11-04

## 2018-08-12 MED ORDER — AMLODIPINE 10 MG OR TABS
10.0000 mg | ORAL_TABLET | Freq: Every day | ORAL | 0 refills | Status: DC
Start: 2018-08-12 — End: 2018-11-04

## 2018-08-12 MED ORDER — HYDROCORTISONE 1 % EX CREA
1.0000 | TOPICAL_CREAM | Freq: Two times a day (BID) | CUTANEOUS | 0 refills | Status: DC
Start: 2018-08-12 — End: 2019-02-24

## 2018-08-12 MED ORDER — HYDROCHLOROTHIAZIDE 25 MG OR TABS
25.0000 mg | ORAL_TABLET | Freq: Every day | ORAL | 0 refills | Status: DC
Start: 2018-08-12 — End: 2018-11-04

## 2018-08-12 NOTE — Progress Notes (Signed)
[place] [facility] Telemedicine Progress Note     Due to COVID-19 pandemic and a federally declared state of public health emergency, this service is being conducted via telephone.     Patient has multiple co-morbidities that pose substantial risk if exposed to COVID-19.     Verbal consent obtained for telephone visit.    SUBJECTIVE:    Felicia [name] is a 63 year old female with history of DMT2, HTN, chronic back pain and hypothyroidism who presents to clinic today for new rash and chronic disease f/u.  Chief Complaint   Patient presents with   . Dermatitis     Unspecifed      [facility]    To Do items from last visit were:  - Check new lab results (hemoglobin A1c, CMP, TSH, CBC, lipid panel) and change medication regimen as needed   - Encourage patient to maintain diet and exercise to keep chronic conditions stable   - Medication refill     HPI:  #. Dermatitis, unspecified  For the past 3 weeks has had intermittent pruritic rash on her bilateral arms and legs, described as "little goose bumps", non-vesicular, non-annular or serpiginous, does have associated burning feeling when in the sun, involving half of her arm or leg or the full arm from shoulder to wrist, does not involved hands/feet/creases or trunk. Rash lasts for 3-4 days before self-resolving with Alovera and then reoccurs. Scratching makes the area erythematous, but no redness otherwise. No history of allergies, no new soap, lotion, laundry detergent, never happened before. Currently does not have rash, but will take photo if it reoccurs.     #. DMT2  Labs drawn this AM, last hemoglobin A1c in 10/2017 (6.2). Not recording blood glucose at home because she thought she didn't need to after metformin was discontinued at last visit. Denies polydipsia, polyuria, numbess/tingling in extremities or vision changes.    - Diet: limiting junk food, cooking at home  - Exercise: Has not been go walking because of  fear of the pandemic and living in unsafe area    #. Hypertension  Patient has not checked blood pressure at home recently. Mild light-headedness when bending over has resolved. Patient denies any headaches, vertigo, syncope, or unsteadiness.    #. Hypothyroidsm  Last TSH 2018 (0.65), on daily levothyroxine. Denies weight changes, heart palpitations, cold/heat tolerance.    #. Chronic Back Pain with Sciatica  Reports chronic lower back pain usually associated with prolonged sitting, pain does not radiate, gabapentin and NSAIDs help. Currently taking ibuprofen maximum once per week as needed for pain.     #. Social Hx:  - Patient denies alcohol, smoking, or drug use  - Patient reports family stays home as much as possible (two sons and husband only go out to work)  - Patient states there is enough food at home    #. Health Maintenance:  - Mammogram - done 11/2017  - Retinopathy exam - done 02/2018  - FOBT - due when testing available   - Shingles vaccine - due when available   - DM microalbumin - due at next lab draw  - DM foot exam - due at next in-person visit  - PHQ9 - due at next in-person visit (Unofficial PHQ2 done over phone today - no significant symptoms of depression/anhedonia)    #. COVID-19 Needs Assessment:  - Documented 05/20/2018   - No current updates    Outpatient Medications Prior to Visit  Medication Sig Dispense Refill   . amLODIPINE (NORVASC) 10 MG tablet Take 1 tablet (10 mg) by mouth daily. 90 tablet 0   . aspirin 81 MG EC tablet Take 1 tablet (81 mg) by mouth daily. 90 tablet 0   . atorvastatin (LIPITOR) 40 MG tablet Take 1 tablet (40 mg) by mouth daily. 90 tablet 0   . famotidine (PEPCID) 20 MG tablet Take 1 tablet (20 mg) by mouth daily as needed (gerd). 60 tablet 0   . gabapentin (NEURONTIN) 300 MG capsule Take 1 capsule (300 mg) by mouth 2 times daily. 180 capsule 0   . hydrochlorothiazide (HYDRODIURIL) 25 MG tablet Take 1 tablet (25 mg) by mouth daily. 90 tablet 0   . ibuprofen  (MOTRIN) 200 MG tablet Take 2 tablets (400 mg) by mouth at bedtime. Take with food. 120 tablet 0   . levothyroxine (SYNTHROID) 150 MCG tablet Take 1 tablet (150 mcg) by mouth every morning (before breakfast). 90 tablet 0   . lisinopril (PRINIVIL, ZESTRIL) 40 MG tablet Take 1 tablet (40 mg) by mouth daily. 90 tablet 0     No facility-administered medications prior to visit.      Patient Med Rec:  - levothyroxine daily   - lisinopril daily   - atorvastatin daily  - hydrochlorothiazide daily   - famotidine daily   - amlodipine daily  - gabapentin BID  - aspirin daily  - ibuprofen PRN (once a week)  - "natural vitamins" unspecified     OBJECTIVE:  Physical Exam and VS deferred because telemedicine visit; pt speaking in full sentences in no acute distress.     Relevant Labs:  - CBC, CMP, hemoglobin A1c, TSH, lipid panel: pending (drawn 07/15)    Relevant Studies/Imaging:  - N/A    Assessment and Plan:  Felicia [name] is a 63 year old female with history of DMT2 (last A1c 6.2, Oct 2019), HTN, chronic back pain and hypothyroidism who presents to clinic today for new rash and chronic disease f/u.    #. Dermatitis, unspecified:  Difficult to assess without image or exam, low concern for shingles (given remission/relapse pattern, lack of vesicles, although would have burning quality) or bacterial/fungal infection (given lack of erythema and non-serpiginous/annular description). Likely contact dermatitis vs. insect bites vs. sun burn vs. drug reaction from "natural vitamins".    - Instructed to text photo to clinic if rash reoccurs - phone number to text provided  - Start hydrocortisone cream 1% if rash persists after application of alovera (up to twice a day on affected area)    #. T2DM  Last Hgb A1c 6.2 nearly at goal, although patient has not been able to exercise recently due to pandemic and concerns about safety. Repeat A1c drawn today.  - F/u in 1 month to discuss results of hemoglobin A1c  - Pt to walk with  daughter and dog x3/week and continue limiting fast food    #. HTN  Has not be checking BP at home, unclear if at goal, reminded of importance of checking.   - Pt instructed to record 10 BPs before next visit   - Continue lisinopril   - Continue hydrochlorothiazide    #. Hypothyroidism:  Asymptomatic, repeat TSH pending.   - Continue levothyroxine.   - f/u TSH    #. Chronic Back Pain/Sciatica  Much improved from last visit, only requiring ibuprofen once a week at most.   - Continue gabapentin   - Continue ibuprofen only when needed     #.  Health Maintenance:  - Mammogram - done 11/2017  - Retinopathy exam - done 02/2018  - FOBT - due when testing available   - Shingles vaccine - due when available   - DM microalbumin - due at next lab draw  - DM foot exam - due at next in-person visit  - PHQ9 - due at next in-person visit    45 minutes were spent with patient on the phone and additional time was spent coordinating labs, medications and follow up.    Next appointment:   - 09/09/2018 -  for lab review,   - 11/04/2018 - 3 month apt for med f/u     To do items at next visit:  - Check new lab results (hemoglobin A1c, CBC, CMP, TSH, lipid panel) and change medication regimen as needed   - Encourage patient to maintain diet and exercise to keep chronic conditions stable   - FOBT - due when testing available   - Shingles vaccine - due when available   - DM microalbumin - due at next lab draw  - DM foot exam - due at next in-person visit  - PHQ9 - due at next in-person visit    Care discussed with Dr. Wardell HeathGlockner.       Ashley AkinBecca Jkai Arwood, MS4.

## 2018-08-12 NOTE — Progress Notes (Signed)
Pt seen and discussed with MS4 Hurman Horn  I confirmed the HPI with the patient and edited the medical student note for accuracy.  See MS4 Hurman Horn note for detailed assessment and plan.    Beverly Gust. Rithika Seel  [phone]

## 2018-08-13 LAB — CHOLESTEROL/HDLC RATIO-QUEST: Chol/HDLC Ratio: 4.3 (calc) (ref <5.0)

## 2018-08-13 LAB — GLYCOSYLATED HGB(A1C), BLOOD: Hgb A1C: 6 % of total Hgb — ABNORMAL HIGH (ref <5.7)

## 2018-08-13 LAB — COMPREHENSIVE METABOLIC PANEL, BLOOD
ALT (SGPT): 16 U/L (ref 6–29)
AST (SGOT): 18 U/L (ref 10–35)
Albumin/Glob Ratio: 1.4 (calc) (ref 1.0–2.5)
Albumin: 4.1 g/dL (ref 3.6–5.1)
Alkaline Phos: 122 U/L (ref 37–153)
BUN: 19 mg/dL (ref 7–25)
Bilirubin, Total: 0.5 mg/dL (ref 0.2–1.2)
Calcium: 9.7 mg/dL (ref 8.6–10.4)
Carbon Dioxide: 27 mmol/L (ref 20–32)
Chloride: 105 mmol/L (ref 98–110)
Creatinine: 0.62 mg/dL (ref 0.50–0.99)
Globulin: 3 g/dL (calc) (ref 1.9–3.7)
Glucose: 103 mg/dL — ABNORMAL HIGH (ref 65–99)
Potassium: 4.5 mmol/L (ref 3.5–5.3)
Sodium: 138 mmol/L (ref 135–146)
Total Protein: 7.1 g/dL (ref 6.1–8.1)
eGFR African American: 110 mL/min/{1.73_m2} (ref >=60)
eGFR non-Afr.American: 95 mL/min/{1.73_m2} (ref >=60)

## 2018-08-13 LAB — TRIGLYCERIDES, BLOOD: Triglycerides: 275 mg/dL — ABNORMAL HIGH (ref <150)

## 2018-08-13 LAB — NON HDL CHOLESTEROL -QUEST: Non-HDL Cholesterol: 143 mg/dL (calc) — ABNORMAL HIGH (ref <130)

## 2018-08-13 LAB — TSH, BLOOD: TSH: 0.04 mIU/L — ABNORMAL LOW (ref 0.40–4.50)

## 2018-08-13 LAB — CHOLESTEROL, TOTAL BLOOD: Cholesterol: 187 mg/dL (ref <200)

## 2018-08-13 LAB — LDL CHOLESTEROL, DIRECT: LDL-Cholesterol: 104 mg/dL (calc) — ABNORMAL HIGH

## 2018-08-13 LAB — HDL-CHOLESTEROL, BLOOD: HDL Cholesterol: 44 mg/dL — ABNORMAL LOW (ref >=50)

## 2018-08-20 ENCOUNTER — Encounter: Payer: Self-pay | Admitting: Family Medicine

## 2018-08-20 NOTE — Progress Notes (Signed)
Labs reviewed in EPIC  Significant for low TSH concerning for overmedication with levothyroxine.   Lab Results   Component Value Date    TSH 0.04 (L) 08/12/2018     Called pt to discuss results. Since pt just got refill of levothyroxine will cont. 150 mg daily M-F and hold weekend doses for now - per pt request.     Repeat TSH in 6-8 weeks.    Consider decreasing dose to 125 mcg at next visit.     Pt to RTC as directed for f/u    N. Norma Fredrickson, M.D.  [id number]

## 2018-09-09 ENCOUNTER — Encounter: Payer: Self-pay | Admitting: Family Medicine

## 2018-09-29 IMAGING — DX DG FINGER LITTLE 2+V*R*
3 series · 3 of 3 positions shown · non-contrast
Comparison: None.

CLINICAL DATA: Status post fall with pain of the fifth digit.

EXAM:
RIGHT LITTLE FINGER 2+V

[finger ap]
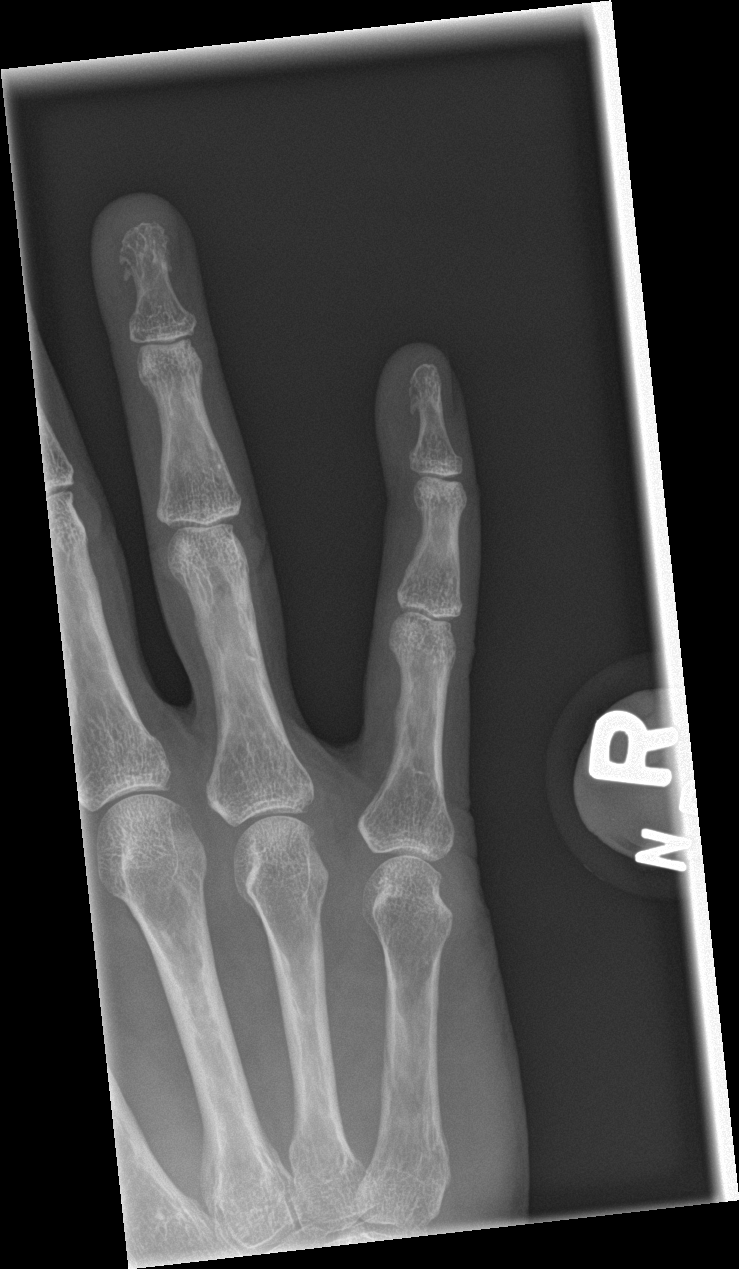

[finger obl]
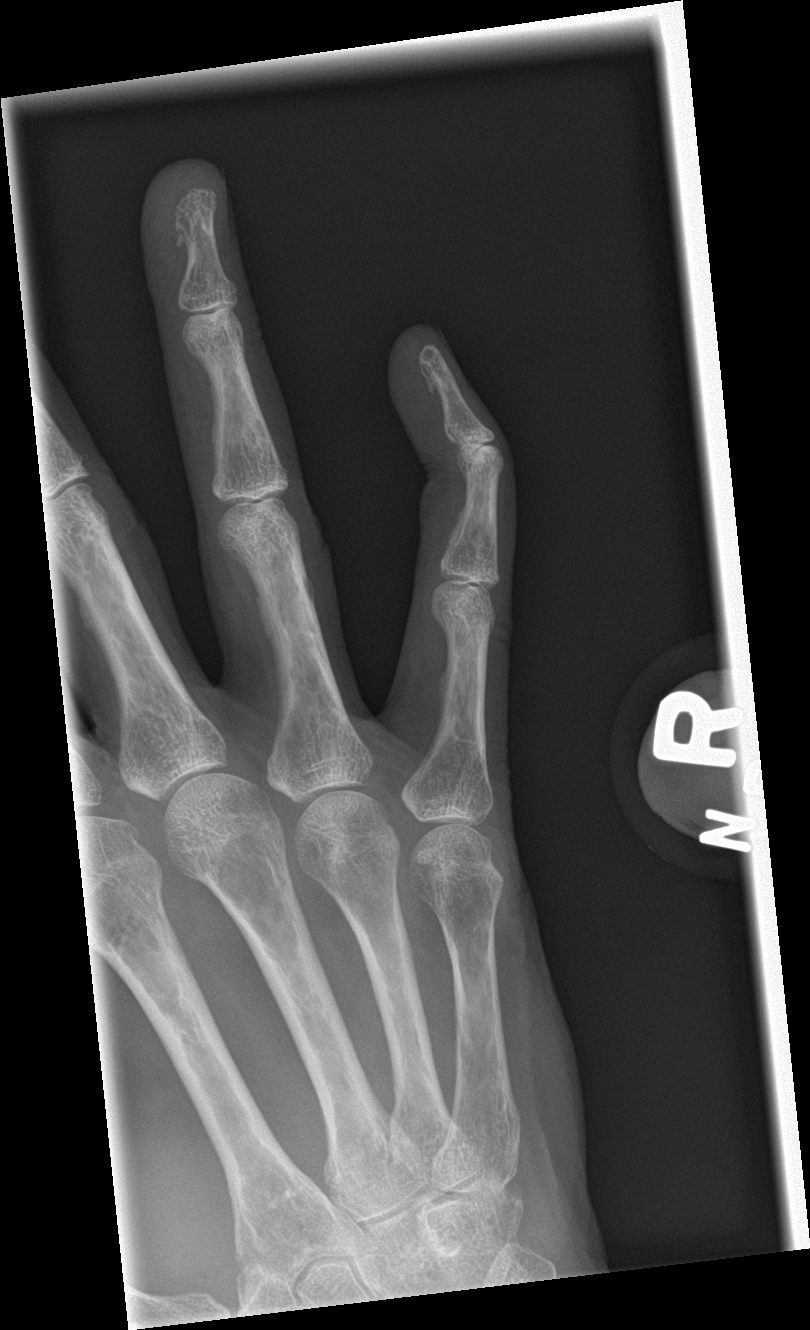

[finger lat]
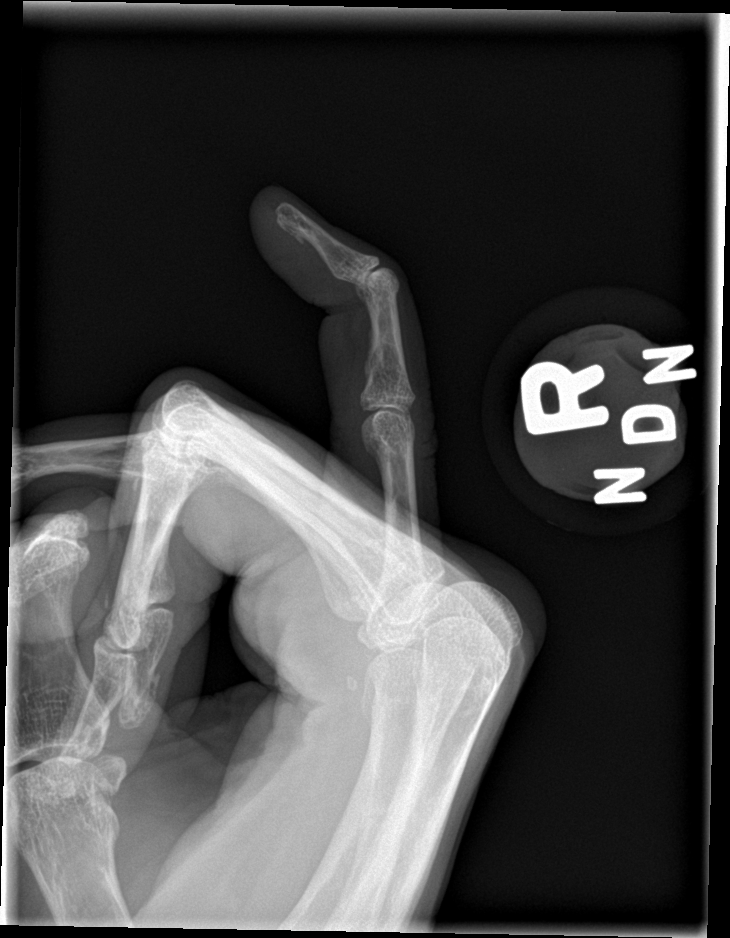

[3 of 3 positions shown; findings below may reference images not displayed]

FINDINGS: There is no evidence of fracture or dislocation. Soft tissues are
unremarkable.
IMPRESSION: No acute fracture or dislocation.

## 2018-11-04 ENCOUNTER — Ambulatory Visit: Payer: Self-pay | Admitting: Internal Medicine

## 2018-11-04 DIAGNOSIS — M549 Dorsalgia, unspecified: Secondary | ICD-10-CM

## 2018-11-04 DIAGNOSIS — E1169 Type 2 diabetes mellitus with other specified complication: Secondary | ICD-10-CM

## 2018-11-04 DIAGNOSIS — I10 Essential (primary) hypertension: Secondary | ICD-10-CM

## 2018-11-04 DIAGNOSIS — E039 Hypothyroidism, unspecified: Secondary | ICD-10-CM

## 2018-11-04 DIAGNOSIS — E119 Type 2 diabetes mellitus without complications: Secondary | ICD-10-CM

## 2018-11-04 DIAGNOSIS — K219 Gastro-esophageal reflux disease without esophagitis: Secondary | ICD-10-CM

## 2018-11-04 DIAGNOSIS — M545 Low back pain, unspecified: Secondary | ICD-10-CM

## 2018-11-04 MED ORDER — ATORVASTATIN CALCIUM 40 MG OR TABS
40.00 mg | ORAL_TABLET | Freq: Every day | ORAL | 0 refills | Status: DC
Start: 2018-11-04 — End: 2019-02-24

## 2018-11-04 MED ORDER — GABAPENTIN 300 MG OR CAPS
300.00 mg | ORAL_CAPSULE | Freq: Two times a day (BID) | ORAL | 0 refills | Status: DC
Start: 2018-11-04 — End: 2019-02-24

## 2018-11-04 MED ORDER — IBUPROFEN 400 MG OR TABS
400.00 mg | ORAL_TABLET | Freq: Every evening | ORAL | 0 refills | Status: DC
Start: 2018-11-04 — End: 2019-02-24

## 2018-11-04 MED ORDER — LISINOPRIL 40 MG OR TABS
40.00 mg | ORAL_TABLET | Freq: Every day | ORAL | 0 refills | Status: DC
Start: 2018-11-04 — End: 2019-02-24

## 2018-11-04 MED ORDER — LEVOTHYROXINE SODIUM 125 MCG OR TABS
125.00 ug | ORAL_TABLET | Freq: Every day | ORAL | 0 refills | Status: DC
Start: 2018-11-04 — End: 2019-02-24

## 2018-11-04 MED ORDER — HYDROCHLOROTHIAZIDE 25 MG OR TABS
25.00 mg | ORAL_TABLET | Freq: Every day | ORAL | 0 refills | Status: DC
Start: 2018-11-04 — End: 2019-02-24

## 2018-11-04 MED ORDER — AMLODIPINE 10 MG OR TABS
10.00 mg | ORAL_TABLET | Freq: Every day | ORAL | 0 refills | Status: DC
Start: 2018-11-04 — End: 2019-02-24

## 2018-11-04 MED ORDER — ASPIRIN EC 81 MG OR TBEC
81.00 mg | DELAYED_RELEASE_TABLET | Freq: Every day | ORAL | 0 refills | Status: DC
Start: 2018-11-04 — End: 2019-02-24

## 2018-11-04 MED ORDER — FAMOTIDINE 20 MG OR TABS
20.00 mg | ORAL_TABLET | Freq: Every day | ORAL | 0 refills | Status: DC | PRN
Start: 2018-11-04 — End: 2019-02-24

## 2018-11-04 NOTE — Progress Notes (Signed)
[place] [facility] Telemedicine Progress Note     Due to COVID-19 pandemic and a federally declared state of public health  emergency, this service is being conducted via telemedicine.     Patient has multiple co-morbidities that pose substantial risk if exposed to COVID-19.     Verbal consent obtained for visit via telephone    SUBJECTIVE:    Felicia [name] is a 63 year old female with hypothyroidism, DM2, HTN, back pain presenting for telephone follow-up.  Chief Complaint   Patient presents with   . Thyroid Problem     Patients last visit at [facility] was 08/12/18.    To Do items from last visit were:  - Check new lab results (hemoglobin A1c, CBC, CMP, TSH, lipid panel) and change medication regimen as needed   - Encourage patient to maintain diet and exercise to keep chronic conditions stable   - FOBT - due when testing available   - Shingles vaccine - due when available   - DM microalbumin - due at next lab draw  - DM foot exam - due at next in-person visit  - PHQ9 - due at next in-person visit     HPI  # Hypothyroidism: Currently Rx 150 mcg M-F, holding weekends. Patient has been taking differently, every daily. Denies anxiety, hyperactivation, diarrhea, oily skin.      # Pruritis: 3 months ago, she developed itching and a rash and was prescribed hydrocortisone cream. She was using twice daily every day and has run out. Minimal itching and the rash has cleared up. She purchased a lotion for diabetics and has been using it regularly.     # Chronic conditions (HTN, DM): She takes her medications regularly. She is going for 20 minute walks every day. She is not adding salt to meals. She has not recently checked her BP at home but does have BP cuff. Denies headaches, dizziness.     # Healthcare maintenance  - She has not received a flu shot this year.     Needs assessment for patient health during Covid 19 pandemic - Documented 05/20/18  No updates today.    Outpatient Medications Prior to Visit      Medication Sig Dispense Refill   . amLODIPINE (NORVASC) 10 MG tablet Take 1 tablet (10 mg) by mouth daily. 90 tablet 0   . aspirin 81 MG EC tablet Take 1 tablet (81 mg) by mouth daily. 90 tablet 0   . atorvastatin (LIPITOR) 40 MG tablet Take 1 tablet (40 mg) by mouth daily. 90 tablet 0   . famotidine (PEPCID) 20 MG tablet Take 1 tablet (20 mg) by mouth daily as needed (gerd). 30 tablet 0   . gabapentin (NEURONTIN) 300 MG capsule Take 1 capsule (300 mg) by mouth 2 times daily. 180 capsule 0   . hydrochlorothiazide (HYDRODIURIL) 25 MG tablet Take 1 tablet (25 mg) by mouth daily. 90 tablet 0   . hydrocortisone 1 % cream Apply 1 Application topically 2 times daily. Apply to affected area 1 Tube 0   . ibuprofen (MOTRIN) 200 MG tablet Take 2 tablets (400 mg) by mouth at bedtime. Take with food. 120 tablet 0   . levothyroxine (SYNTHROID) 150 MCG tablet Take 1 tablet (150 mcg) by mouth every morning (before breakfast). 90 tablet 0   . lisinopril (PRINIVIL, ZESTRIL) 40 MG tablet Take 1 tablet (40 mg) by mouth daily. 90 tablet 0     No facility-administered medications prior to visit.  OBJECTIVE:  Physical Exam and VS deferred because telemedicine visit    Relevant Labs:  TSH 0.04* (07/15)   A1c 6.0* (07/15)    Cholesterol 187 (07/15) HDL 44* (07/15) LDL   Triglycerides 275* (07/15)    Na 138 (07/15) CL 105 (07/15) BUN 19 (07/15) GLU   103* (07/15)   K 4.5 (07/15) CO2 27 (07/15) Cr 0.62 (07/15)      Assessment and Plan:    Felicia [name] is a 63 year old female seen for hypothyroidism, pruritis, DM2, HLD, HTN.     #Hypothyroidism  - Decrease levothyroxine to 125 mcg daily.   - Repeat TSH in one month to assess response    # Pruritis  - Continue using normal lotion at home  - Call clinic if rash recurs for physical exam visit    #Health maintenance:  - Encouraged continuation of current diet and exercise routine. Patient is doing very well.  - Flu shot scheduled for 12/01/2018 at 3:00 in [place]  - BP checks  at home     20 minutes were spent with patient on the phone and additional time was spent coordinating labs, medications and follow up.    Next appointment: 02/24/2019    To do items at next visit:  - Follow-up BP log  - Follow-up hypothyroidism   - Encourage patient to maintain diet and exercise to keep chronic conditions stable   - FOBT - due when testing available   - Shingles vaccine - due when available   - DM microalbumin - due at next lab draw  - DM foot exam - due at next in-person visit  - PHQ9 - due at next in-person visit     Patient will pick up medications next Tuesday in [place]. Phone number provided.     Tressia Danas, MS4

## 2018-11-12 ENCOUNTER — Other Ambulatory Visit: Payer: Self-pay | Admitting: Family Medicine

## 2018-11-13 LAB — TSH, BLOOD: TSH: 0.21 mIU/L — ABNORMAL LOW (ref 0.40–4.50)

## 2018-12-02 ENCOUNTER — Encounter: Payer: Self-pay | Admitting: Family Medicine

## 2019-02-23 NOTE — Progress Notes (Signed)
[facility] Telemedicine Progress Note     Due to COVID-19 pandemic and a federally declared state of public health  emergency, this service is being conducted via telemedicine.     Patient has multiple co-morbidities that pose  substantial risk if exposed to COVID-19.     Verbal consent obtained for visit via telephone.    SUBJECTIVE:  Felicia [name] is a 64 year old female with PMH of hypothyroidism, DM2, HTN, and foot pain presenting for f/u chronic conditions and med refill.    Chief complaint:   Chief Complaint   Patient presents with   . Musculoskeletal Problem     Foot pain       Patients last visit at [facility] was 11/04/18.    To Do items from last visit were:  - Follow-up BP log  - Follow-up hypothyroidism   - Encourage patient to maintain diet and exercise to keep chronic conditions stable  - FOBT - due when testing available   - Shingles vaccine - due when available   - DM microalbumin - due at next lab draw  - DM foot exam - due at next in-person visit  - PHQ9 - due at next in-person visit     HPI  States she is feeling good today but notes some pain in her feet.    #Foot pain  Described as a bone type pain. Experiences the pain only after she walks for a long time and usually when wearing non-supportive shoes without a heel. It started ~3 months ago, denies any trauma.  - Has taken ibuprofen for the pain which helps  - Heat during a shower helps with the pain as well    #Hypothyroidism  Has been taking adjusted dose of levo123mcg down from 15mcg as prescribed, although she has been taking it with lunch instead of before breakfast. Feels well overall, fatigued at times but does not think it is excessive. Denies any weight gain or hair loss.  - Levothyroxine     #HTN  Has not kept log, stayed with her son for a period of time and was not able to keep track. Has been taking her medications as prescribed. Denies chest pain, palpitations.  - States she will start keeping a log now  that she is back home  - Amlodipine  - HCTZ  - Lisinopril    #DM2  States that she ate pretty poorly over the holidays but is trying to do better now. She hasn't been able to exercise. Denies chest pain, SOB, polydipsia, polyphagia, polyuria.  - States she will try to work on her diet more and to be more active    #HLD  Taking medication as prescribed. Denies any myalgias.  - Atorvastatin    #Pruritis  States that it has improved with HC. Feels that improved after levo dose adjustment. No concerns at this time.    #. Health Maintenance:  - Flu shot, states she did not get the flu shot, she forgot to go to her appointment on 02/04/19 but would still like a flu shot if available but has not gotten COVID vax at this time but interested  - Shingles vaccine, has not gotten and would prefer not to at this time  - DM foot exam, at next in person visit    Social Hx:  - Household: Lives at home with her husband and 2 children  - Patient states there is enough food at home   - Smoking:  denies use  - Alcohol: denies use  - Drug-Use: denies use    Allergies:  No known allergies    COVID-19 Screening:  Patient denies any recent illness, cough, sore throat, fevers, SOB, muscle aches, URI, headaches, or loss of smell/taste. Patient is unaware of any recent COVID-19 contacts. She is sheltering in place at home and following social distancing precautions.    COVID-19 Needs Assessment:  - Documented on 05/20/18  - No current updates.    Review of Systems:   As noted in HPI, denies headache, fatigue, shortness of breath, palpitations, abdominal pain, n/v, constipation/diarrhea, skin changes.      Outpatient Medications Prior to Visit   Medication Sig Dispense Refill   . amLODIPINE (NORVASC) 10 MG tablet Take 1 tablet (10 mg) by mouth daily. 120 tablet 0   . aspirin 81 MG EC tablet Take 1 tablet (81 mg) by mouth daily. 120 tablet 0   . atorvastatin (LIPITOR) 40 MG tablet Take 1 tablet (40 mg) by mouth daily. 120 tablet 0   . famotidine  (PEPCID) 20 MG tablet Take 1 tablet (20 mg) by mouth daily as needed (gerd). 60 tablet 0   . gabapentin (NEURONTIN) 300 MG capsule Take 1 capsule (300 mg) by mouth 2 times daily. 240 capsule 0   . hydrochlorothiazide (HYDRODIURIL) 25 MG tablet Take 1 tablet (25 mg) by mouth daily. 120 tablet 0   . hydrocortisone 1 % cream Apply 1 Application topically 2 times daily. Apply to affected area 1 Tube 0   . ibuprofen (MOTRIN) 400 MG tablet Take 1 tablet (400 mg) by mouth at bedtime. Take with food. 100 tablet 0   . levothyroxine (SYNTHROID) 125 MCG tablet Take 1 tablet (125 mcg) by mouth every morning (before breakfast). 120 tablet 0   . lisinopril (PRINIVIL, ZESTRIL) 40 MG tablet Take 1 tablet (40 mg) by mouth daily. 120 tablet 0     No facility-administered medications prior to visit.        OBJECTIVE:  Physical Exam and VS deferred because telemedicine visit    Relevant Labs:  TSH: 0.21 (11/12/19); 0.04 (08/12/19)    Relevant Studies/Imaging:  No new studies/imaging    Assessment and Plan:    Felicia [name] is a 64 year old female with PMH of hypothyroidism, DM2, HTN, and foot pain presenting for f/u chronic conditions and med refill.    #Foot pain bilateral  Pain most notable after longer walks with flat footwear such as sandals. Pain noted when the patient pressed on the plantar surface of her heel. Most likely biomechanical from poor footwear but could also be caused by plantar fasciitis or possibly achilles tendinitis.  - Advised patient to purchase supportive athletic shoes to walk 10 minutes every day  - Will call back in 1 month should the pain worsen or not improve for in person visit for diagnosis  - Will continue ibuprofen and heat for acute pain episodes PRN    #Hypothyroidism  TSH in July 2020 0.04, In October dose of levo adjusted and TSH at that time 0.21. Will recheck TSH now that she has been on the new dose for 3 months.  - Levothyroxine 125 mg before breakfast  - Recheck TSH  - Consider dose  adjust to 100 mg pending TSH results    #HTN  During patient encounter, BP checked now during the encounter, states 188/82 but was feeling nervous and rushed at the time and the machine gave her an error message  when attempting to check multiple times. Denies chest pain or SOB.  - Will be seen in 1 week to check BP  to review log in case med changes are needed  - BP log, patient states that now that she is home she will start tracking her BP regularly  - Amlodipine 10 mg daily  - HCTZ 25 mg daily  - Lisinopril 40 mg daily    #DM2  Last A1C July 2020 6.0 (high of 7.3 in 2016). Is attempting to correct with diet and exercise alone but notes in the setting of the pandemic and with the holidays she has neglected both. Advised patient on dietary changes and exercise. Denies any red flag sx's, polyuria, polydipsia, polyphagia.  - Labs A1C, lipid panel, and microalbumin  - Encouraged patient to continue working on diet and exercise    #HLD  Is tolerating current med regimen denying any myalgias. Will recheck lipids with lab draw.  - Lipid panel  - Atorvastatin 40 mg daily    #Pruritis (resolved)  Has resolved since last visit with OTC moisturizers.  - Reassess should sx's return    #COVID-19 vaccine  Patient 38 with DM and HTN and eligible for vaccine. Given county vaccine scheduling website. Patient is not good with computers but lives with her son who will help her schedule.  - Hold flu vaccine pending COVID vaccine    Health Care Maintenance (HCM)  Patient eligible for COVID-19 vaccine, given [place] county scheduling website  Colon cancer (Note: unable to do FOBT at this time): due when available  Mammogram/PAP if appropriate (done via referral to a community health center, message social work if questions): up to date  Annual Flu (schedule via Conservation officer, nature): will hold pending covid-19 vaccine  Pneumovax/Tdap/Shingles vaccines as needed (unable to do at this time): declines shingles vaccine at this time  Diabetes-specific  HCM as appropriate (note: unable to do retinopathy screening at this time but can refer to ophtho)  Contraception if appropriate (done via referral to a community health center, message social work if questions):    25 minutes were spent with patient on the phone and additional time was spent coordinating labs, medications and follow up.    Next appointment: 05/19/19    To do items at next visit:  - f/u with patient in 1 week to review recent BP measurements  - f/u lab results (A1C, lipid panel, TSH, microalbumin)  - f/u foot pain  - f/u covid-19 vaccine  - f/u BP log  - when available DM foot exam, FOBT, PHQ9    Patient will pick up medications next Tuesday at [facility], was provided address and phone number.      Patient seen over telephone and evaluated with Dr. Wardell Heath    Note written by Sheliah Hatch, MS4; Burton Apley, MS1

## 2019-02-24 ENCOUNTER — Encounter: Payer: Self-pay | Admitting: Family Medicine

## 2019-02-24 ENCOUNTER — Ambulatory Visit: Payer: Self-pay | Admitting: Family Medicine

## 2019-02-24 DIAGNOSIS — E039 Hypothyroidism, unspecified: Secondary | ICD-10-CM

## 2019-02-24 DIAGNOSIS — E119 Type 2 diabetes mellitus without complications: Secondary | ICD-10-CM

## 2019-02-24 DIAGNOSIS — M79671 Pain in right foot: Secondary | ICD-10-CM

## 2019-02-24 DIAGNOSIS — E785 Hyperlipidemia, unspecified: Secondary | ICD-10-CM

## 2019-02-24 DIAGNOSIS — K219 Gastro-esophageal reflux disease without esophagitis: Secondary | ICD-10-CM

## 2019-02-24 DIAGNOSIS — L309 Dermatitis, unspecified: Secondary | ICD-10-CM

## 2019-02-24 DIAGNOSIS — M545 Low back pain, unspecified: Secondary | ICD-10-CM

## 2019-02-24 DIAGNOSIS — M549 Dorsalgia, unspecified: Secondary | ICD-10-CM

## 2019-02-24 DIAGNOSIS — I10 Essential (primary) hypertension: Secondary | ICD-10-CM

## 2019-02-24 MED ORDER — ASPIRIN EC 81 MG OR TBEC (CUSTOM)
81.0000 mg | DELAYED_RELEASE_TABLET | Freq: Every day | ORAL | 0 refills | Status: DC
Start: 2019-02-24 — End: 2019-05-19

## 2019-02-24 MED ORDER — HYDROCORTISONE 1 % EX CREA
1.0000 | TOPICAL_CREAM | Freq: Two times a day (BID) | CUTANEOUS | 0 refills | Status: DC
Start: 2019-02-24 — End: 2019-11-03

## 2019-02-24 MED ORDER — LISINOPRIL 40 MG OR TABS
40.0000 mg | ORAL_TABLET | Freq: Every day | ORAL | 0 refills | Status: DC
Start: 2019-02-24 — End: 2019-05-19

## 2019-02-24 MED ORDER — GABAPENTIN 300 MG OR CAPS
300.0000 mg | ORAL_CAPSULE | Freq: Two times a day (BID) | ORAL | 0 refills | Status: DC
Start: 2019-02-24 — End: 2019-05-19

## 2019-02-24 MED ORDER — LEVOTHYROXINE SODIUM 125 MCG OR TABS
125.0000 ug | ORAL_TABLET | Freq: Every day | ORAL | 0 refills | Status: DC
Start: 2019-02-24 — End: 2019-05-19

## 2019-02-24 MED ORDER — FAMOTIDINE 20 MG OR TABS
20.0000 mg | ORAL_TABLET | Freq: Every day | ORAL | 0 refills | Status: DC | PRN
Start: 2019-02-24 — End: 2019-05-19

## 2019-02-24 MED ORDER — IBUPROFEN 400 MG OR TABS
400.0000 mg | ORAL_TABLET | Freq: Every evening | ORAL | 0 refills | Status: DC
Start: 2019-02-24 — End: 2019-05-19

## 2019-02-24 MED ORDER — HYDROCHLOROTHIAZIDE 25 MG OR TABS
25.0000 mg | ORAL_TABLET | Freq: Every day | ORAL | 0 refills | Status: DC
Start: 2019-02-24 — End: 2019-05-19

## 2019-02-24 MED ORDER — AMLODIPINE 10 MG OR TABS
10.0000 mg | ORAL_TABLET | Freq: Every day | ORAL | 0 refills | Status: DC
Start: 2019-02-24 — End: 2019-05-19

## 2019-02-24 MED ORDER — ATORVASTATIN CALCIUM 40 MG OR TABS
40.0000 mg | ORAL_TABLET | Freq: Every day | ORAL | 0 refills | Status: DC
Start: 2019-02-24 — End: 2019-05-19

## 2019-02-24 NOTE — Progress Notes (Signed)
Pt seen and discussed with MS4 Rice and MS1 Tiffany  I confirmed the HPI with the patient and edited the medical student note for accuracy.  See MS2 note for detailed assessment and plan.    Georga Hacking. Thersia Petraglia  [phone]

## 2019-03-03 ENCOUNTER — Encounter: Payer: Self-pay | Admitting: Family Medicine

## 2019-03-03 ENCOUNTER — Ambulatory Visit: Payer: Self-pay | Admitting: Family Medicine

## 2019-03-03 DIAGNOSIS — I10 Essential (primary) hypertension: Secondary | ICD-10-CM

## 2019-03-03 NOTE — Progress Notes (Signed)
[facility] Telemedicine Progress Note     Due to COVID-19 pandemic and a federally declared state of public health  emergency, this service is being conducted via telemedicine.     Patient has multiple co-morbidities that pose  substantial risk if exposed to COVID-19.     Verbal consent obtained for visit via telephone.    SUBJECTIVE:    Felicia [name] is a 64 year old female presenting for follow up for her hypertension.    Patients last visit at [facility] was 02/24/2019.    To Do items from last visit were:  - f/u with patient in 1 week to review recent BP measurements    HPI  #. Hypertension  Patient reports the following BP measurements:  1/28 139/62  2/3 (this visit) 152/69  Denies headache, changes in vision, chest pain, lightheadedness, dizziness, or palpitations. Overall patient reports feeling well.  She does note that her GERD symptoms worsen when she takes all of her medications in the morning, which she takes without food because she eats breakfast/lunch late in the day.    Outpatient Medications Prior to Visit   Medication Sig Dispense Refill   . amLODIPINE (NORVASC) 10 MG tablet Take 1 tablet (10 mg) by mouth daily. 90 tablet 0   . aspirin 81 MG EC tablet Take 1 tablet (81 mg) by mouth daily. 90 tablet 0   . atorvastatin (LIPITOR) 40 MG tablet Take 1 tablet (40 mg) by mouth daily. 90 tablet 0   . famotidine (PEPCID) 20 MG tablet Take 1 tablet (20 mg) by mouth daily as needed (gerd). 60 tablet 0   . gabapentin (NEURONTIN) 300 MG capsule Take 1 capsule (300 mg) by mouth 2 times daily. 180 capsule 0   . hydrochlorothiazide (HYDRODIURIL) 25 MG tablet Take 1 tablet (25 mg) by mouth daily. 90 tablet 0   . hydrocortisone 1 % cream Apply 1 Application topically 2 times daily. Apply to affected area 1 Tube 0   . ibuprofen (MOTRIN) 400 MG tablet Take 1 tablet (400 mg) by mouth at bedtime. Take with food. 100 tablet 0   . levothyroxine (SYNTHROID) 125 MCG tablet Take 1 tablet (125 mcg) by  mouth every morning (before breakfast). 90 tablet 0   . lisinopril (PRINIVIL, ZESTRIL) 40 MG tablet Take 1 tablet (40 mg) by mouth daily. 90 tablet 0     No facility-administered medications prior to visit.        OBJECTIVE:  Physical Exam and VS deferred because telemedicine visit    Relevant Labs: none    Relevant Studies/Imaging: none    Assessment and Plan:    Felicia [name] is a 64 year old female with PMHx HTN presenting for follow up regarding her HTN.    #. Hypertension  BP appears well controlled with current regimen, though patient did not record her BP daily.  No evidence of side effects with medication, including hypotension.  - Continue her BP regimen: Amlodipine 10 mg daily, HCTZ 25 mg daily, Lisinopril 40 mg daily  - Check BP 2-3x/week until her next visit, will follow up regarding her BP log then  - Okay to take all her medications later in the day with food as long as patient takes them at the same time every day    #. COVID-19 vaccine  Patient agrees to receiving vaccine.  Notified that patient will be added to list of people who will be contacted regarding a meeting to provide a  safe place for patients to ask questions about the vaccine.  - Patient provided with link at previous visit to schedule vaccine.    20 minutes were spent with patient on the phone and additional time was spent coordinating labs, medications and follow up.    Next appointment: 05/19/2019    To do items at next visit:  - f/u BP log  - f/u lab results (A1C, lipid panel, TSH, microalbumin)  - f/u foot pain  - f/u covid-19 vaccine  - when available DM foot exam, FOBT, PHQ9    Patient seen and discussed with Dr. Norma Fredrickson.    Zenovia Jarred, MS4

## 2019-03-08 ENCOUNTER — Encounter (INDEPENDENT_AMBULATORY_CARE_PROVIDER_SITE_OTHER): Payer: Self-pay

## 2019-03-10 ENCOUNTER — Encounter (INDEPENDENT_AMBULATORY_CARE_PROVIDER_SITE_OTHER): Payer: Self-pay

## 2019-04-01 ENCOUNTER — Other Ambulatory Visit: Payer: Self-pay | Admitting: Family Medicine

## 2019-04-02 LAB — URINALYSIS
Bilrubin: NEGATIVE
Blood: NEGATIVE
Glucose: NEGATIVE
Hyaline Casts: NONE SEEN /LPF
Ketones: NEGATIVE
Leuk Esterase: NEGATIVE
Nitrite: NEGATIVE
Protein, Urine: NEGATIVE
Specific Gravity: 1.018 (ref 1.001–1.035)
pH: 7 (ref 5.0–8.0)

## 2019-04-02 LAB — GLYCOSYLATED HGB(A1C), BLOOD: Hgb A1C: 7.3 % of total Hgb — ABNORMAL HIGH (ref <5.7)

## 2019-04-02 LAB — CHOLESTEROL, TOTAL BLOOD: Cholesterol: 147 mg/dL (ref <200)

## 2019-04-02 LAB — NON HDL CHOLESTEROL -QUEST: Non-HDL Cholesterol: 112 mg/dL (calc) (ref <130)

## 2019-04-02 LAB — CHOLESTEROL/HDLC RATIO-QUEST: Chol/HDLC Ratio: 4.2 (calc) (ref <5.0)

## 2019-04-02 LAB — HDL-CHOLESTEROL, BLOOD: HDL Cholesterol: 35 mg/dL — ABNORMAL LOW (ref >=50)

## 2019-04-02 LAB — LDL CHOLESTEROL, DIRECT: LDL-Cholesterol: 74 mg/dL (calc)

## 2019-04-02 LAB — TSH, BLOOD: TSH: 0.4 mIU/L (ref 0.40–4.50)

## 2019-04-02 LAB — TRIGLYCERIDES, BLOOD: Triglycerides: 312 mg/dL — ABNORMAL HIGH (ref <150)

## 2019-04-04 ENCOUNTER — Encounter: Payer: Self-pay | Admitting: Family Medicine

## 2019-04-04 NOTE — Progress Notes (Signed)
Labs reviewed in EPIC  Significant for low TSH   Lab Results   Component Value Date    TSH 0.40 04/01/2019     Plan to decrease dose of levothyroxine to 100 mcg at next visit in April. Will then recheck labs in 6-8 wks     Pt to RTC as directed for f/u    N. Sherlon Handing, M.D.  [id number]

## 2019-04-14 ENCOUNTER — Telehealth (INDEPENDENT_AMBULATORY_CARE_PROVIDER_SITE_OTHER): Payer: Self-pay

## 2019-04-14 NOTE — Telephone Encounter (Signed)
Called patient to assist with scheduling COVID vaccine. Left message.

## 2019-04-15 NOTE — Telephone Encounter (Signed)
Left voicemail for patient about scheduling 1st dose of SARS-Cov-2 Vaccine.

## 2019-05-19 ENCOUNTER — Ambulatory Visit: Payer: Self-pay | Admitting: Internal Medicine

## 2019-05-19 DIAGNOSIS — M545 Low back pain, unspecified: Secondary | ICD-10-CM

## 2019-05-19 DIAGNOSIS — M549 Dorsalgia, unspecified: Secondary | ICD-10-CM

## 2019-05-19 DIAGNOSIS — I10 Essential (primary) hypertension: Secondary | ICD-10-CM

## 2019-05-19 DIAGNOSIS — K219 Gastro-esophageal reflux disease without esophagitis: Secondary | ICD-10-CM

## 2019-05-19 DIAGNOSIS — M79671 Pain in right foot: Secondary | ICD-10-CM

## 2019-05-19 DIAGNOSIS — E1169 Type 2 diabetes mellitus with other specified complication: Secondary | ICD-10-CM

## 2019-05-19 DIAGNOSIS — E039 Hypothyroidism, unspecified: Secondary | ICD-10-CM

## 2019-05-19 DIAGNOSIS — L309 Dermatitis, unspecified: Secondary | ICD-10-CM

## 2019-05-19 DIAGNOSIS — E119 Type 2 diabetes mellitus without complications: Secondary | ICD-10-CM

## 2019-05-19 MED ORDER — LEVOTHYROXINE SODIUM 100 MCG OR TABS
100.0000 ug | ORAL_TABLET | Freq: Every day | ORAL | 0 refills | Status: DC
Start: 2019-05-19 — End: 2019-08-11

## 2019-05-19 MED ORDER — ASPIRIN EC 81 MG OR TBEC (CUSTOM)
81.0000 mg | DELAYED_RELEASE_TABLET | Freq: Every day | ORAL | 0 refills | Status: DC
Start: 2019-05-19 — End: 2019-08-11

## 2019-05-19 MED ORDER — HYDROCHLOROTHIAZIDE 25 MG OR TABS
25.0000 mg | ORAL_TABLET | Freq: Every day | ORAL | 0 refills | Status: DC
Start: 2019-05-19 — End: 2019-08-11

## 2019-05-19 MED ORDER — GABAPENTIN 300 MG OR CAPS
300.0000 mg | ORAL_CAPSULE | Freq: Two times a day (BID) | ORAL | 0 refills | Status: DC
Start: 2019-05-19 — End: 2019-08-11

## 2019-05-19 MED ORDER — FAMOTIDINE 20 MG OR TABS
20.0000 mg | ORAL_TABLET | Freq: Every day | ORAL | 0 refills | Status: DC | PRN
Start: 2019-05-19 — End: 2019-08-11

## 2019-05-19 MED ORDER — ATORVASTATIN CALCIUM 40 MG OR TABS
40.0000 mg | ORAL_TABLET | Freq: Every day | ORAL | 0 refills | Status: DC
Start: 2019-05-19 — End: 2019-08-11

## 2019-05-19 MED ORDER — IBUPROFEN 400 MG OR TABS
400.0000 mg | ORAL_TABLET | Freq: Every evening | ORAL | 0 refills | Status: DC
Start: 2019-05-19 — End: 2019-08-11

## 2019-05-19 MED ORDER — AMLODIPINE 10 MG OR TABS
10.0000 mg | ORAL_TABLET | Freq: Every day | ORAL | 0 refills | Status: DC
Start: 2019-05-19 — End: 2019-08-11

## 2019-05-19 MED ORDER — LISINOPRIL 40 MG OR TABS
40.0000 mg | ORAL_TABLET | Freq: Every day | ORAL | 0 refills | Status: DC
Start: 2019-05-19 — End: 2019-08-11

## 2019-05-19 MED ORDER — METFORMIN HCL 500 MG OR TABS
500.0000 mg | ORAL_TABLET | Freq: Two times a day (BID) | ORAL | 0 refills | Status: DC
Start: 2019-05-19 — End: 2019-08-11

## 2019-05-19 NOTE — Progress Notes (Signed)
[facility] Telemedicine Progress Note     Due to COVID-19 pandemic and a federally declared state of public health  emergency, this service is being conducted via telemedicine.     Patient has multiple co-morbidities that pose  substantial risk if exposed to COVID-19.     Verbal consent obtained for visit via telephone   SUBJECTIVE:    Felicia [name] is a 64 year old female with a history of HTN, DM2, hypothyroidism, and sciatica presenting today for follow-up of the above concerns and medication management.   Chief Complaint   Patient presents with    Hypertension       Patients last visit at [facility] was 03/03/19.    To Do items from last visit were:  - f/u BP log  - f/u lab results (A1C, lipid panel, TSH, microalbumin)  - f/u foot pain  - f/u covid-19 vaccine  - when available DM foot exam, FOBT, PHQ9    HPI  #HTN  Taking amlodipine 10mg , HCTZ 25, lisinopril 40 daily. Home blood pressure readings 2-3x/week, in 120s-130s, highest 146/65 today. No HA, blurry vision, chest pain, dizziness, palpitations.     #DM2  Previously taking metformin, however discontinued in summer 2019 for A1c of 6.0. Most recent A1c 7.3 3/21. Not currently taking any medication or checking her blood sugar. Weight at home a couple of months ago was 196, now 183 with diet changes, losing about one pound per week, eating more vegetables and cutting out sweets and sodas. Previously was walking 30 minutes daily before pandemic, now not, although amenable to increasing exercise.     Denies polyuria or polydipsia.    Continues on ASA and atorvastatin. Last retinopathy screen 02/2018 normal.     #Hypothyroidism  On levothyroxine 125 mcg daily, recent TSH 0.4. Denies unintentional weight loss, sweating, hot flashes, anxiety, palpitations, irritability.    #Sciatica  No current back pain or sciatic pain on gabapentin 300mg  BID. Infrequent ibuprofen use.    #Foot pain  Bilateral "bone pain" on bottom of feet after walking for  extended periods. Denies trauma. Currently wearing sandals and heels with 1.5 inch heel. Relief with ibuprofen and warm water.     Outpatient Medications Prior to Visit   Medication Sig Dispense Refill    amLODIPINE (NORVASC) 10 MG tablet Take 1 tablet (10 mg) by mouth daily. 90 tablet 0    aspirin 81 MG EC tablet Take 1 tablet (81 mg) by mouth daily. 90 tablet 0    atorvastatin (LIPITOR) 40 MG tablet Take 1 tablet (40 mg) by mouth daily. 90 tablet 0    famotidine (PEPCID) 20 MG tablet Take 1 tablet (20 mg) by mouth daily as needed (gerd). 60 tablet 0    gabapentin (NEURONTIN) 300 MG capsule Take 1 capsule (300 mg) by mouth 2 times daily. 180 capsule 0    hydrochlorothiazide (HYDRODIURIL) 25 MG tablet Take 1 tablet (25 mg) by mouth daily. 90 tablet 0    hydrocortisone 1 % cream Apply 1 Application topically 2 times daily. Apply to affected area 1 Tube 0    ibuprofen (MOTRIN) 400 MG tablet Take 1 tablet (400 mg) by mouth at bedtime. Take with food. 100 tablet 0    levothyroxine (SYNTHROID) 125 MCG tablet Take 1 tablet (125 mcg) by mouth every morning (before breakfast). (Patient taking differently: Take 100 mcg by mouth every morning (before breakfast). Change to 100 mcg daily at next visit in April based on low TSH)  90 tablet 0    lisinopril (PRINIVIL, ZESTRIL) 40 MG tablet Take 1 tablet (40 mg) by mouth daily. 90 tablet 0     No facility-administered medications prior to visit.        OBJECTIVE:  Physical Exam and VS deferred because telemedicine visit    Relevant Labs:  04/01/19  A1c 7.3  LDL 74  Triglycerides 312  TSH 0.40    Relevant Studies/Imaging:  None new    Assessment and Plan:    Felicia [name] is a 64 year old female seen for:  #HTN  Blood pressure control improved on current regimen. Will continue to ask patient to record readings 2-3x/week. Suspect that with weight loss efforts may be able to reduce medication amounts in future.  -Amlodipine 10  -Lisinopril 40  -HCTZ 25     #DM2  Most  recent A1c 3/21 7.3 (elevated from 6.0 7/20). Will re-initiate metformin 500mg  BID and re-check A1c in 3 months. If no improvement will increase metformin dose and consider asking patient to keep home glucose log.   -Metformin 500 mg BID  -Continue ASA, atorvastatin  -Check A1c and microalbmin in 3 months  -Referral to opthamology    #Hypothyroidism  Will decrease levothyroxine to 137mcg given TSH 0.4, plan to re-check TSH before next visit in 3 months.  -Levothyroxine 173mcg 1day    #Sciatica  Currently well-controlled.  -Continue Gabapentin 300 mg BID and ibuprofen PRN    #Foot pain  Bilateral foot pain most consistent with lack of supportive footwear given lack of trauma and point tenderness. Advised patient to purchase more supportive shoes. Will also refer to podiatry for further evaluation and potential orthotics.  -Heat PRN for symptomatic relief  -Stretches and exercises provided    Health Care Maintenance (HCM)  Colon cancer: FOBT due  Mammogram/PAP if appropriate: Due, scheduling info provided  Pneumovax: Due, pharmacy aware  Tdap: UTD  Shingles: Due, pharmacy aware  Diabetes-specific HCM: Needs diabetic foot exam, optho referral placed    40 minutes were spent with patient on the phone and additional time was spent coordinating labs, medications and follow up.    Next appointment: 08/11/19    To do items at next visit:  -F/u blood pressure logs  -F/u weight loss  -F/u A1c, microalbumin  -F/u TSH  -F/u foot pain/podiatry referral  -F/u Covid vaccine  -F/u shingles, pneumovax  -F/u mammogram  -F/u Pap

## 2019-05-22 NOTE — Progress Notes (Signed)
I participated in a telemedicine visit and aGREE WITH THE PLAN

## 2019-07-05 ENCOUNTER — Ambulatory Visit: Payer: Self-pay | Admitting: Podiatrist

## 2019-07-05 DIAGNOSIS — M25473 Effusion, unspecified ankle: Secondary | ICD-10-CM

## 2019-07-05 NOTE — Progress Notes (Incomplete)
[facility] Progress Note    Student(s): [employer]    Attending: Dr. Erlene Senters      SUBJECTIVE:    Felicia [name] is a 64 year old female with bilateral foot pain and ankle edema.    HPI:    Patient present with bilateral ankle pain and edema in ankle. Patient had ibuprofen recommended for the pain and reported initial improvement but now reports little improvement. Patient denies ankle trauma recent or remote or any ankle injury. Patient denies pain the the calve bilateral. Denies any imaging done on lower extremities.      Current Outpatient Medications   Medication Sig    amLODIPINE (NORVASC) 10 MG tablet Take 1 tablet (10 mg) by mouth daily.    aspirin 81 MG EC tablet Take 1 tablet (81 mg) by mouth daily.    atorvastatin (LIPITOR) 40 MG tablet Take 1 tablet (40 mg) by mouth daily.    famotidine (PEPCID) 20 MG tablet Take 1 tablet (20 mg) by mouth daily as needed (gerd).    gabapentin (NEURONTIN) 300 MG capsule Take 1 capsule (300 mg) by mouth 2 times daily.    hydrochlorothiazide (HYDRODIURIL) 25 MG tablet Take 1 tablet (25 mg) by mouth daily.    hydrocortisone 1 % cream Apply 1 Application topically 2 times daily. Apply to affected area    ibuprofen (MOTRIN) 400 MG tablet Take 1 tablet (400 mg) by mouth at bedtime. Take with food.    levothyroxine (SYNTHROID) 100 MCG tablet Take 1 tablet (100 mcg) by mouth every morning (before breakfast).    lisinopril (PRINIVIL, ZESTRIL) 40 MG tablet Take 1 tablet (40 mg) by mouth daily.    metFORMIN (GLUCOPHAGE) 500 MG tablet Take 1 tablet (500 mg) by mouth 2 times daily (with meals).     No current facility-administered medications for this visit.       Review of Systems:    OBJECTIVE:     Podiatry Exam: Pain on passive dorsiflexion, plantarflexion, and inversion of right ankle No pain on passive eversion on right ankle. Non pitting edema medial and lateral on bilateral ankle R greater than L. Pain on passive plantar flexion,  dorsiflexion and passive inversion on left ankle. Non palpable DP PT pulses palpable on right. 1/4 PT and 2/4 DP on left. Tenderness on palpation of deltoid ligament and anterior TF ligament bilateral R greater than L.       Labs:    FM [facility] 08/27/2017 03/19/2017   PHQ2 Total 3 2   Some recent data might be hidden         Assessment and Plan:    RX for X rays given to patient. Recommended OTC analgesic creams (biofreeze, aspercream, tiger balm) to be used qhs prn. Recommended that she wear athletic shoes with adjustable laces for follow up appointment for application of foot and ankle compression if needed. X Ray images to be completed before follow up appointment.      PAVS   minutes/week   I have counseled Ms. Jozwiak on improving her level of exercise in the following way:     Deferred discussion about exercise due to competing priorities and/or relevance to the current visit. Will address at a future relevant visit.           Health Care Maintenance:  Contraception:  Immunizations:  Cancer Screening:    Patient to RTC on:    To do items at next visit:      Patient  seen, discussed and examined with Dr. Nehemiah Settle

## 2019-07-05 NOTE — Progress Notes (Signed)
[facility] Progress Note    Student(s): Felicia [name]    Attending: Dr. Nehemiah Settle      SUBJECTIVE:    Felicia [name] is a 64 year old female with bilateral foot pain and ankle edema.    HPI:    Patient present with bilateral foot pain and edema in ankle. Patient reprots takiing ibuprofen for pain. Denies pain the the calve region. Denies having images taken.       Current Outpatient Medications   Medication Sig    amLODIPINE (NORVASC) 10 MG tablet Take 1 tablet (10 mg) by mouth daily.    aspirin 81 MG EC tablet Take 1 tablet (81 mg) by mouth daily.    atorvastatin (LIPITOR) 40 MG tablet Take 1 tablet (40 mg) by mouth daily.    famotidine (PEPCID) 20 MG tablet Take 1 tablet (20 mg) by mouth daily as needed (gerd).    gabapentin (NEURONTIN) 300 MG capsule Take 1 capsule (300 mg) by mouth 2 times daily.    hydrochlorothiazide (HYDRODIURIL) 25 MG tablet Take 1 tablet (25 mg) by mouth daily.    hydrocortisone 1 % cream Apply 1 Application topically 2 times daily. Apply to affected area    ibuprofen (MOTRIN) 400 MG tablet Take 1 tablet (400 mg) by mouth at bedtime. Take with food.    levothyroxine (SYNTHROID) 100 MCG tablet Take 1 tablet (100 mcg) by mouth every morning (before breakfast).    lisinopril (PRINIVIL, ZESTRIL) 40 MG tablet Take 1 tablet (40 mg) by mouth daily.    metFORMIN (GLUCOPHAGE) 500 MG tablet Take 1 tablet (500 mg) by mouth 2 times daily (with meals).     No current facility-administered medications for this visit.       Review of Systems:    OBJECTIVE:   Podiatry Exam: Pain on passive dorsiflexion of right ankle. Non pitting edema medial and lateral on right ankle. Pain with passive inversion on right. No pain on passive eversion on right. Pain on passive plantar flexion on right. Non palpable DP TP pulses palpable on right. 1/4 TP and 2/4 DP on left. Pain with passive dorsiflexion on left. Pain with passive inversion on left. Tenderness on palpation of deltoid  ligament and anterior TF ligament bilateral.      Labs:    [facility] 08/27/2017 03/19/2017   PHQ2 Total 3 2   Some recent data might be hidden         Assessment and Plan:    PAVS   minutes/week   I have counseled [name] on improving her level of exercise in the following way:                Health Care Maintenance:  Contraception:  Immunizations:  Cancer Screening:    Patient to RTC on:    To do items at next visit:      Patient seen, discussed and examined with ***

## 2019-07-12 ENCOUNTER — Other Ambulatory Visit: Payer: Self-pay | Admitting: Family Medicine

## 2019-07-13 LAB — GLYCOSYLATED HGB(A1C), BLOOD: Hgb A1C: 7.8 % of total Hgb — ABNORMAL HIGH (ref <5.7)

## 2019-07-13 LAB — MICROALBUMIN, RANDOM URINE - QUEST
Microalbumin Random: 0.5 mg/dL
Microalbumin Ratio: 8 mcg/mg creat (ref <30)

## 2019-07-13 LAB — RANDOM URINE CREATININE: Creatinine, Random Urine: 59 mg/dL (ref 20–275)

## 2019-07-13 LAB — TSH, BLOOD: TSH: 1.52 mIU/L (ref 0.40–4.50)

## 2019-07-16 ENCOUNTER — Encounter: Payer: Self-pay | Admitting: Family Medicine

## 2019-07-16 NOTE — Progress Notes (Signed)
Labs reviewed in EPIC  Significant for worsening DM control with   Lab Results   Component Value Date    A1C 7.8 (H) 07/12/2019     Plan to increase metformin to 1000 mg BID at next visit and repeat A1C in 3 months to re-eval control.     Pt to RTC as directed for f/u on 08/11/19     N. Sherlon Handing, M.D.  [id number]

## 2019-08-11 ENCOUNTER — Ambulatory Visit: Payer: Self-pay | Admitting: Family Medicine

## 2019-08-11 DIAGNOSIS — E119 Type 2 diabetes mellitus without complications: Secondary | ICD-10-CM

## 2019-08-11 DIAGNOSIS — E1169 Type 2 diabetes mellitus with other specified complication: Secondary | ICD-10-CM

## 2019-08-11 DIAGNOSIS — M549 Dorsalgia, unspecified: Secondary | ICD-10-CM

## 2019-08-11 DIAGNOSIS — K219 Gastro-esophageal reflux disease without esophagitis: Secondary | ICD-10-CM

## 2019-08-11 DIAGNOSIS — R252 Cramp and spasm: Secondary | ICD-10-CM

## 2019-08-11 DIAGNOSIS — M5430 Sciatica, unspecified side: Secondary | ICD-10-CM

## 2019-08-11 DIAGNOSIS — I10 Essential (primary) hypertension: Secondary | ICD-10-CM

## 2019-08-11 DIAGNOSIS — M545 Low back pain, unspecified: Secondary | ICD-10-CM

## 2019-08-11 DIAGNOSIS — E039 Hypothyroidism, unspecified: Secondary | ICD-10-CM

## 2019-08-11 MED ORDER — IBUPROFEN 400 MG OR TABS
400.0000 mg | ORAL_TABLET | Freq: Every evening | ORAL | 0 refills | Status: DC
Start: 2019-08-11 — End: 2019-11-03

## 2019-08-11 MED ORDER — ASPIRIN EC 81 MG OR TBEC (CUSTOM)
81.0000 mg | DELAYED_RELEASE_TABLET | Freq: Every day | ORAL | 0 refills | Status: DC
Start: 2019-08-11 — End: 2019-11-03

## 2019-08-11 MED ORDER — ATORVASTATIN CALCIUM 40 MG OR TABS
40.0000 mg | ORAL_TABLET | Freq: Every day | ORAL | 0 refills | Status: DC
Start: 2019-08-11 — End: 2019-11-03

## 2019-08-11 MED ORDER — LISINOPRIL 40 MG OR TABS
40.0000 mg | ORAL_TABLET | Freq: Every day | ORAL | 0 refills | Status: DC
Start: 2019-08-11 — End: 2019-11-03

## 2019-08-11 MED ORDER — LEVOTHYROXINE SODIUM 100 MCG OR TABS
100.0000 ug | ORAL_TABLET | Freq: Every day | ORAL | 0 refills | Status: DC
Start: 2019-08-11 — End: 2019-11-03

## 2019-08-11 MED ORDER — AMLODIPINE 10 MG OR TABS
10.0000 mg | ORAL_TABLET | Freq: Every day | ORAL | 0 refills | Status: DC
Start: 2019-08-11 — End: 2019-11-03

## 2019-08-11 MED ORDER — FAMOTIDINE 20 MG OR TABS
20.0000 mg | ORAL_TABLET | Freq: Every day | ORAL | 0 refills | Status: DC | PRN
Start: 2019-08-11 — End: 2019-11-03

## 2019-08-11 MED ORDER — SPIRONOLACTONE 25 MG OR TABS
12.5000 mg | ORAL_TABLET | Freq: Every day | ORAL | 0 refills | Status: DC
Start: 2019-08-11 — End: 2019-11-03

## 2019-08-11 MED ORDER — METFORMIN HCL 1000 MG OR TABS
1000.0000 mg | ORAL_TABLET | Freq: Two times a day (BID) | ORAL | 0 refills | Status: DC
Start: 2019-08-11 — End: 2019-11-03

## 2019-08-11 MED ORDER — HYDROCHLOROTHIAZIDE 25 MG OR TABS
25.0000 mg | ORAL_TABLET | Freq: Every day | ORAL | 0 refills | Status: DC
Start: 2019-08-11 — End: 2019-11-03

## 2019-08-11 MED ORDER — GABAPENTIN 300 MG OR CAPS
300.0000 mg | ORAL_CAPSULE | Freq: Two times a day (BID) | ORAL | 0 refills | Status: DC
Start: 2019-08-11 — End: 2019-11-03

## 2019-08-11 NOTE — Progress Notes (Signed)
Visit conducted by telemedicine alongside wonderful MS4 Marquis Lunch. Patient is interested in getting shingles pneumovax vaccines and with recent COVID-19 vaccination administration    #HTN:   - Ambulatory monitoring: 130-140s/60s  - Amlodipine 10 mg PO qdaily, HCTZ 25 mg PO qdaily, and lisinopril 40 mg PO qdaily   - Noting peripheral edema when seated, start spironolactone 12.5 mg PO qdaily  - F/U BMP in 1 week from when she gets med (2 weeks from now)   - In person BP visit in 2 weeks     #T2DM:   - A1C now 7.8%   - Due for diabetic foot exam in 2 weeks  - Meds: metformin 1000 mg PO BID from 500 BID  - F/U BMP in 1 week as above   - A1C in 3 months and consider [place]     #Hypothyroidism:   - Last checked 07/12/2019 with value of 1.52   - On synthroid 100 mcg PO qdaily  - Recheck TSH 3 months     #HCM:   - Mammogram overdue   - Immunizations: overdue for pneumovax and shingles and pap and mammo

## 2019-08-11 NOTE — Progress Notes (Signed)
[facility] Telemedicine Progress Note     Due to COVID-19 pandemic and a federally declared state of public health  emergency, this service is being conducted via telemedicine.     Patient has multiple co-morbidities that pose  substantial risk if exposed to COVID-19.     Verbal consent obtained for visit via telephone.    SUBJECTIVE:    Felicia [name] is a 64 year old female w/hx of HTN, T2DM, hypothyroidism presenting w/ bilateral cramping of the legs.  Chief Complaint   Patient presents with    Musculoskeletal Problem     muscle cramping of b/l legs       [facility].    To Do items from last visit were:  -F/u blood pressure logs  -F/u weight loss  -F/u A1c, microalbumin  -F/u TSH  -F/u foot pain/podiatry referral  -F/u Covid vaccine  -F/u shingles, pneumovax  -F/u mammogram  -F/u Pap    HPI    #HTN  -Managed with amlodipine 10, lisinopril 40, HCTZ 25   -Home BP (checks every 3rd day):    -May: 137/62, 140/62, 137/58, 133/61, 134/59 (checks every   -June: 131/52, 124/56   -July: 137/62, 126/61, 131/54, 143/61 (today)  -Denies BP w/ systolic >150-160s  -Denies BP w/ systolic <90-100s  -Endorses LE swelling for 3 days, believes that when she's sitting, her legs are dangling. Recently has started resting feet on step-stool which has relieved swelling.  -Denies symptoms of CP, SOB, palpitations,  -Denies lightheadedness or dizziness.    #T2DM  -HbA1c 7.3 (04/01/19) => 7.8 (07/12/19).   -Diet: vegetables, chicken, fish, little rice, sometimes 1 tortilla, however sometimes 3-4 enchiladas  -Exercise: walks 20 minutes every morning, not increased from baseline.  -Denies diabetic foot pain/neuropathy that used to be an issue at prior visit (see #Foot Pain below for further details)   -Denies change in vision.     #Cramping Legs  Started 2 months ago. Characterizes the pain as a "cramping sensation" that only happens at night when trying to fall asleep. Denies any  trauma or recent changes. Not taking any statins. Denies restless leg syndrome symptoms. Patient does endorse improvement with moving around in the daytime as well as with heat and massage therapy.    #Foot Pain, bilateral  -Seen by podiatry June 2021 to evaluate bilateral burning foot pain, most likely 2/2 diabetic polyneuropathy:  Recommended heat PRN, stretches, exercises, supportive footwear to help alleviate symptoms. Patient states that stretches and exercises have helped but supportive footwear has not helped much. X-ray of the feet was also ordered but patient has not scheduled appt for it as she feels her symptoms improved on their own. Currently, feels like feet feel much better and does not think it is an issue any longer.    #Hypothyroidism  At last visit, levothyroxine decreased to due to TSH 0.4. Most recent TSH (07/12/19): 1.52. Endorses weight loss (186 lb 6 mo ago => 174 lb today), self reported. Denies dry skin. Endorses little mouth "sores" inside her mouth that take some time to heal as well as dry, burning eyes.      #Sciatica  Currently well-controlled on gabapentin 300mg  and ibuprofen PRN.     #Health Maintenance  -Pap Smear: Overdue  -Mammogram: Has appt scheduled.  -Diabetic foot exam due at next in-person visit  -COVID-19 Vaccines: 1st dose: May 11, 2nd dose: June 1 - unsure which brand she received.  -  Due for shingles and pneumoccocal vaccine       Outpatient Medications Prior to Visit   Medication Sig Dispense Refill    amLODIPINE (NORVASC) 10 MG tablet Take 1 tablet (10 mg) by mouth daily. 90 tablet 0    aspirin 81 MG EC tablet Take 1 tablet (81 mg) by mouth daily. 90 tablet 0    atorvastatin (LIPITOR) 40 MG tablet Take 1 tablet (40 mg) by mouth daily. 90 tablet 0    famotidine (PEPCID) 20 MG tablet Take 1 tablet (20 mg) by mouth daily as needed (gerd). 60 tablet 0    gabapentin (NEURONTIN) 300 MG capsule Take 1 capsule (300 mg) by mouth 2 times daily. 180 capsule 0     hydrochlorothiazide (HYDRODIURIL) 25 MG tablet Take 1 tablet (25 mg) by mouth daily. 90 tablet 0    hydrocortisone 1 % cream Apply 1 Application topically 2 times daily. Apply to affected area 1 Tube 0    ibuprofen (MOTRIN) 400 MG tablet Take 1 tablet (400 mg) by mouth at bedtime. Take with food. 100 tablet 0    levothyroxine (SYNTHROID) 100 MCG tablet Take 1 tablet (100 mcg) by mouth every morning (before breakfast). 90 tablet 0    lisinopril (PRINIVIL, ZESTRIL) 40 MG tablet Take 1 tablet (40 mg) by mouth daily. 90 tablet 0    metFORMIN (GLUCOPHAGE) 500 MG tablet Take 1 tablet (500 mg) by mouth 2 times daily (with meals). (Patient taking differently: Take 1,000 mg by mouth 2 times daily (with meals). Increase to 1000 mg BID on 08/11/19 due to elevated A1C) 180 tablet 0     No facility-administered medications prior to visit.       OBJECTIVE:  Physical Exam and VS deferred because telemedicine visit    Relevant Labs:    Orders Only on 07/12/2019   Component Date Value Ref Range Status    Creatinine, Random Urine 07/12/2019 59  20 - 275 mg/dL Final    Microalbumin Random 07/12/2019 0.5  See Note: mg/dL Final    Microalbumin Ratio 07/12/2019 8  <30 mcg/mg creat Final    TSH 07/12/2019 1.52  0.40 - 4.50 mIU/L Final    Hgb A1C 07/12/2019 7.8* <5.7 % of total Hgb Final   Orders Only on 04/01/2019   Component Date Value Ref Range Status    Cholesterol 04/01/2019 147  <200 mg/dL Final    HDL Cholesterol 04/01/2019 35* > OR = 50 mg/dL Final    Triglycerides 04/01/2019 312* <150 mg/dL Final    LDL-Cholesterol 04/01/2019 74  mg/dL (calc) Final    Chol/HDLC Ratio 04/01/2019 4.2  <5.0 (calc) Final    Non-HDL Cholesterol 04/01/2019 112  <130 mg/dL (calc) Final    Color 16/10/9602 YELLOW  YELLOW Final    Appearance 04/01/2019 CLOUDY* CLEAR Final    Specific Gravity 04/01/2019 1.018  1.001 - 1.035 Final    pH 04/01/2019 7.0  5.0 - 8.0 Final    Glucose 04/01/2019 NEGATIVE  NEGATIVE Final    Bilrubin 04/01/2019  NEGATIVE  NEGATIVE Final    Ketones 04/01/2019 NEGATIVE  NEGATIVE Final    Blood 04/01/2019 NEGATIVE  NEGATIVE Final    Protein, Urine 04/01/2019 NEGATIVE  NEGATIVE Final    Nitrite 04/01/2019 NEGATIVE  NEGATIVE Final    Leuk Esterase 04/01/2019 NEGATIVE  NEGATIVE Final    WBC 04/01/2019 0-5  < OR = 5 /HPF Final    RBC 04/01/2019 0-2  < OR = 2 /HPF Final    Squam  EpithelialCells 04/01/2019 6-10* < OR = 5 /HPF Final    Bacteria 04/01/2019 MODERATE* NONE SEEN /HPF Final    Hyaline Casts 04/01/2019 NONE SEEN  NONE SEEN /LPF Final    TSH 04/01/2019 0.40  0.40 - 4.50 mIU/L Final    Hgb A1C 04/01/2019 7.3* <5.7 % of total Hgb Final         Relevant Studies/Imaging:    Assessment and Plan:    Judeth HornMaria Moreno Delconte is a 64 year old female w/hx of HTN, T2DM, hypothyroidism presenting w/ bilateral cramping of the legs.    #HTN, slightly above goal  Asymptomatic, with home BP mostly 130s/50s with some spikes to 140s/60s. Managed with amlodipine 10, lisinopril 40, HCTZ 25. Given co-morbid T2DM and some lower leg edema, would benefit from better BP control with low-dose diuretic. Can start aldactone 12.5 mg with plans to titrate up if needed based on f/u. Will check e-lytes (K+ in particular) to monitor.   -Start aldactone 12.5 mg daily   -BMP in 2 weeks (1 week after starting aldactone)    #T2DM, uncontrolled  -HbA1c 7.3 (04/01/19) => 7.8 (07/12/19), not well controlled despite dietary modifications and daily walking regiment. Otherwise asymptomatic. Will increase metformin 500mg  => 1000mg  for now and recheck HbA1c to track responsiveness. Consider adding GLP-1 agonist or anti-DPP4 if refractory. Overdue for diabetic foot exam as well.   -Increase metformin to 1000mg     - HbA1c in 3 mo before f/u   -Diabetic foot exam at 3 week f/u    #Muscle spasms  Cramping sensation of legs started 2 months ago. Most likely MSK-related/muscle spasms given nature of cramps + improvement with movement, heat, and massage in the absence  of trauma or myopathic medications (eg. Statins). May be worth r/o electrolyte disturbance etiology w/ Mg + BMP.   - Check mag + BMP      #Hypothyroidism  At last visit, levothyroxine decreased to 100mcg due to TSH 0.4. Most recent TSH (07/12/19): 1.52. Endorses weight loss (186 lb 6 mo ago => 174 lb today), self reported and intentional due to diet. Denies dry skin. Endorses little mouth "sores" inside her mouth that take some time to heal as well as dry, burning eyes. Difficult to know what the "mouth sores" or "burning eyes" are with telephone visit - can further workup at 3 week follow-up. Check TSH in 3 mo given recent change to levothyroxine dose.   -f/u "mouth sores" + "burning eyes" at 3 wk f/u   -Recheck TSH in 3 mo.    Sciatica  Currently well-controlled on gabapentin 300mg  and ibuprofen PRN.    -CTM    #Health Maintenance  -Pap Smear: Overdue  -Mammogram: Has appt scheduled.  -Diabetic foot exam due at next in-person visit  -COVID-19 Vaccines: 1st dose: May 11, 2nd dose: June 1 - unsure which brand she received.  -Due for shingles and pneumoccocal vaccine       PAVS Total Activity: 140 minutes/week minutes/week   I have counseled Ms. Streater on improving her level of exercise in the following way:     Advised patient to engage in physical activity as recommended in the patient education handout.       60 minutes were spent with patient on the phone and additional time was spent coordinating labs, medications and follow up.    [facility]: [facility]10 [facility]    To do items at next visit:  -f/u aldactone 12.5 mg response + K+  level  -f/u BMP + Mg  -f/u muscle spasms  -f/u mouth sores + dry eyes   -diabetic foot exam  -f/u A1c in 3 mo.  -f/u mammogram, pap smear  -f/u Shingles Vax + PPSV23 vax

## 2019-08-14 ENCOUNTER — Telehealth: Payer: Self-pay | Admitting: Family Medicine

## 2019-08-14 NOTE — Telephone Encounter (Signed)
Counseled patient on her new prescription for spironolactone and additionally answered questions that she had about the medication. She stated that she has not yet received the medication and will be picking it up in person next week.

## 2019-08-31 ENCOUNTER — Other Ambulatory Visit: Payer: Self-pay | Admitting: Family Medicine

## 2019-09-01 LAB — GLYCOSYLATED HGB(A1C), BLOOD: Hgb A1C: 7.1 % of total Hgb — ABNORMAL HIGH (ref <5.7)

## 2019-09-01 LAB — BASIC METABOLIC PANEL, BLOOD
BUN: 19 mg/dL (ref 7–25)
Calcium: 9.1 mg/dL (ref 8.6–10.4)
Carbon Dioxide: 28 mmol/L (ref 20–32)
Chloride: 103 mmol/L (ref 98–110)
Creatinine: 0.59 mg/dL (ref 0.50–0.99)
Glucose: 116 mg/dL — ABNORMAL HIGH (ref 65–99)
Potassium: 4.7 mmol/L (ref 3.5–5.3)
Sodium: 135 mmol/L (ref 135–146)
eGFR African American: 111 mL/min/{1.73_m2} (ref >=60)
eGFR non-Afr.American: 96 mL/min/{1.73_m2} (ref >=60)

## 2019-09-01 LAB — MAGNESIUM, BLOOD: Magnesium: 2 mg/dL (ref 1.5–2.5)

## 2019-09-01 LAB — TSH, BLOOD: TSH: 2.7 mIU/L (ref 0.40–4.50)

## 2019-09-02 ENCOUNTER — Encounter: Payer: Self-pay | Admitting: Family Medicine

## 2019-09-02 ENCOUNTER — Ambulatory Visit: Payer: Self-pay | Admitting: Family Medicine

## 2019-09-02 DIAGNOSIS — E119 Type 2 diabetes mellitus without complications: Secondary | ICD-10-CM

## 2019-09-02 DIAGNOSIS — M25473 Effusion, unspecified ankle: Secondary | ICD-10-CM

## 2019-09-02 DIAGNOSIS — I10 Essential (primary) hypertension: Secondary | ICD-10-CM

## 2019-09-02 DIAGNOSIS — K121 Other forms of stomatitis: Secondary | ICD-10-CM

## 2019-09-02 DIAGNOSIS — Z723 Lack of physical exercise: Secondary | ICD-10-CM

## 2019-09-02 DIAGNOSIS — M5430 Sciatica, unspecified side: Secondary | ICD-10-CM

## 2019-09-02 DIAGNOSIS — M7989 Other specified soft tissue disorders: Secondary | ICD-10-CM

## 2019-09-02 DIAGNOSIS — E039 Hypothyroidism, unspecified: Secondary | ICD-10-CM

## 2019-09-02 NOTE — Progress Notes (Signed)
[facility] NOTE      SUBJECTIVE:    Felicia [name] is a 63 year old female with PMHx significant for Type 2 diabetes, HTN, and hypothyroidism who presents for follow up.      Last visit on 08/11/19:  Items to follow up:   -f/u aldactone 12.5 mg response + K+ level  -f/u BMP + Mg  -f/u muscle spasms  -f/u mouth sores + dry eyes   -diabetic foot exam  -f/u A1c in 3 mo.  -f/u mammogram, pap smear  -f/u Shingles Vax + PPSV23 vax    #DM  -Medications: metformin increased to 1000mg  (last visit)  -Does not check BG b/c does not have machine. Requesting machine.    -Denies vision changes, L arm neuropathy (x2 times), polydipsia, + polyuria  -Exercise: none, interested in EIM call  -Diet: has changed her diet for last 2 months with recent addition of salads & berries, removing chips and bread. Reports losing 15lb in 1 month.   [ ]  diabetic foot exam - pending, will complete today    #HTN  -Medications: amlopdipine 10, lisinopril 40, HCTZ 25, aldactone 12.5 (stopped last 4 days b/c frequent urinartion).  -Checks bp readings: q3days in AM, 130/<80   -Denies headaches, vision changes, chest pain/pressure, SOB, hx of cardiac problems such as heart failure  +lower extremity swelling (although reports improvement on HCTZ and aldactone; noticed swelling after starting amlodipine), +dizziness when laying down  -LE swelling: swelling in legs for the past 6 months; reports worsening pain in legs with standing for a long time; leg exercises help her with pain    #Mouth ulcers   -Pt is concerned about mouth ulcers that appear as papules located in the inferior and superior lips.   -States that she has had these papules for 6 years now.  -Papules are seasonal - appear in summertime, disappear in wintertime.  -Denies hx of Herpes infection.   -Reports seeing dentist pre-COVID and has since not been seen at dental clinic. Pt is in agreement for f/u dental visit.   -Never been treated before for mouth ulcers.  -States that the burning  pain she experiences with the ulcers improves with gargles of bicarbonate.     #Hypothyroidism   -Reports taking Levothyroxine .     #Sciatica   -Gabapentin 300mg  and ibuprofen PRN    Current Outpatient Medications   Medication Sig    amLODIPINE (NORVASC) 10 MG tablet Take 1 tablet (10 mg) by mouth daily.    aspirin 81 MG EC tablet Take 1 tablet (81 mg) by mouth daily.    atorvastatin (LIPITOR) 40 MG tablet Take 1 tablet (40 mg) by mouth daily.    famotidine (PEPCID) 20 MG tablet Take 1 tablet (20 mg) by mouth daily as needed (gerd).    gabapentin (NEURONTIN) 300 MG capsule Take 1 capsule (300 mg) by mouth 2 times daily.    hydrochlorothiazide (HYDRODIURIL) 25 MG tablet Take 1 tablet (25 mg) by mouth daily.    hydrocortisone 1 % cream Apply 1 Application topically 2 times daily. Apply to affected area    ibuprofen (MOTRIN) 400 MG tablet Take 1 tablet (400 mg) by mouth at bedtime. Take with food.    levothyroxine (SYNTHROID) 100 MCG tablet Take 1 tablet (100 mcg) by mouth every morning (before breakfast).    lisinopril (PRINIVIL, ZESTRIL) 40 MG tablet Take 1 tablet (40 mg) by mouth daily.    metFORMIN (GLUCOPHAGE) 1000 MG tablet Take 1 tablet (1,000  mg) by mouth 2 times daily (with meals). Increase to 1000 mg BID on 08/11/19 due to elevated A1C    spironolactone (ALDACTONE) 25 MG tablet Take 0.5 tablets (12.5 mg) by mouth daily.     No current facility-administered medications for this visit.     Review of Systems: as seen in HPI      OBJECTIVE:  BP 132/76    Pulse 91    Temp 97.3 F (36.3 C)    Resp 18    Wt 80.3 kg (177 lb)    SpO2 98%    BMI 33.44 kg/m     Physical Exam  General: Awake, alert, pleasant and cooperative, NAD.  HEENT: EOMI, MMM, neck supple, +b/l submandibular LAD, flesh-colored papules notable in the inferior lip. No notable papules appreciated in the superior lip (although pt reported that she also has papules present there) See image below. Dentition is poor.   CV: RRR. No  murmurs, clicks, or gallops appreciated.  Resp: Clear to auscultation bilaterally. Crackles appreciated in L>R posterior lung fields  Abdomen: NABS. Soft, nondistended, nontender. No masses appreciated.  Extremities: WWP. 2+ distal pulses. No cyanosis, clubbing, or +trace pitting edema b/l LE  Neuro: Moving all extremities spontaneously.   Skin: skin tags     Flesh-colored papules        In-Clinic Tests:  Diabetic Foot exam: normal sensation in b/l feet  FOBT: negative 0/3    LABS:    FM [facility] 08/27/2017 03/19/2017   PHQ2 Total 3 2   Some recent data might be hidden       Na 135 (08/03) CL 103 (08/03) BUN 19 (08/03) GLU   116* (08/03)   K 4.7 (08/03) CO2 28 (08/03) Cr 0.59 (08/03)      A1c 7.1* (08/03)  TSH 2.70 (08/03) FT4       07/12/19 Microalbumin ratio: 8 (wnl)      ASSESSMENT & PLAN:    #DM, improving  Based off of HgbA1c, pt's DM is improving: 7.8 (07/12/19)-->7.1 (08/31/19), although BMP glucose (08/31/19) was elevated at 116. Unable to review BG readings, since pt does not have machine to check. Improvement of HgbA1c can be partly linked with diet modifications that patient has made, allowing her to lose 15lb in 1 month and possibly the increase in her metformin to 1000mg  BID.Microalbumin ratio (07/12/19) was wnl and diabetic foot exam in office was normal. No need for repeat labs at this visit, but would recommend BMP and HgbA1c for next visit in October.   -Continue metformin 1000mg  BID (increased last visit)  -Encouraged pt to continue with diet modifications  -Encouraged pt to start exercising as pt currently does not exercise. Scheduled virtual EIM visit on 8/16 at 10AM.   [ ]  Will obtain glucometer for patient and strips  [ ]  Order HbgA1c and BMP next labs    #LE swelling, chronic   Started 6 months ago. Likely differential include - amlodipine (likely d/t pt reporting that she noticed swelling after starting amlodipine; however, pt is currently on a small dose and she has been prescribed medication since  12/2016 and swelling started within 6 months), undiagnosed HF (no EKG/ECHO on file; crackles were appreciated on physical exam and pt notices LE swelling even after being on 2 diuretics, denied dyspnea, orthopnea, PND), myxedema 2/2 hypothyroidism (hx of hypothyroidism, recent TSH wnl) pulmonary HTN 2/2 sleep apnea, or chronic venous disease (unlikely given that there is no apparent skin pigment changes, induration or ulceration). Less  likely include liver disease (last liver panel was normal), renal disease (Cr, GFR wnl), or pelvic neoplasm if all other causes are excluded that be visualized with transvaginal U/S or CT of pelvis.    -Continue diuretic regimen: HCTZ and aldactone (of note, K was wnl in last lab)  -Recommend EKG/ECHO in next visit to r/o HF as cause  -May consider changing amlodipine -->hydralazine   [ ]  order BNP with next labs    #HTN, well controlled  BP well controlled per pt's report of BP in 130s/<80s. Asymptomatic.   -Continue Medications: amlopdipine 10, lisinopril 40, HCTZ 25, aldactone 12.5   -Advised pt to bring in BP log next visit with AM/PM readings    #Mouth ulcers   Hx of mouth ulcers for 6 years, with prevalence during summertime. On OP exam, lower lip has scattered papules (as seen in image) with poor dentition. Submandibular LAD present b/l. 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 May consider treating her empirically with acyclovir for HSV 800mg  daily for ppx or ordering HSV DNA PCR or serologic tests IgG/IgM to confirm.   -Ordered dental appointment    -May recommend pt zinc-based sunscreen to prevent HSV-1 reactivation during summer    #Hypothyroidism      -Continue Levothyroxine .     #Sciatica   -Gabapentin 300mg  and ibuprofen PRN    Health Care Maintenance:  [Done] diabetic foot exam - normal  [Done] COVID - pt states that she already completed COVID vaccines   [Pending] Pap smear, mammogram (encouraged to schedule) - pt waiting for appointment.  [Done 09/02/19] FOBT pt returned to clinic   [Pending] pneumovax, shingles vaccines    Patient to RTC on: 11/03/19 @PB      To do items at next visit:  -f/u DM BG readings at home (ensure that pt has supplies to check at home); order HgbA1c and BMP with next labs  -f/u lower extremity swelling - order BNP next labs; EKG to r/o HF; review compliance to x2 diuretics  -f/u HTN - review bp log  -f/u mouth ulcers - dental apt. ?  -f/u HCM: pap smear/ mammogram status; shingles/pneumovax     Patient seen, discussed and examined with Dr. .  Attending: Dr. 11/02/19.     ____________________  01/03/20, MS4   Bilingual Spanish Translator

## 2019-09-07 ENCOUNTER — Encounter: Payer: Self-pay | Admitting: Family Medicine

## 2019-09-07 NOTE — Progress Notes (Signed)
Med only visit for glucometer    N. Sherlon Handing, M.D.

## 2019-09-13 ENCOUNTER — Telehealth: Payer: Self-pay

## 2019-09-13 NOTE — Progress Notes (Signed)
Collaborative Care Encounter 09/13/2019   Length of Session 15   Session Number 1   Mode of Consultation Telephone [facility] health coaching   Some recent data might be hidden     Brief primary care behavioral health [facility]) telephone call with patient regarding physical activity (PA) levels, identified by the Physical Activity Vital Sign (PAVS) screening tool conducted today following patient's recent PCP visit. Patient is currently in the action stage of change for PA, as s/he is engaging in some exercise but not meeting guidelines of 150 min/week. Reviewed the PA Manual with patient and used motivational interviewing strategies to identify benefits, barriers, goals, and self-monitoring for increasing current PA levels.  Patient is currently not active. Used to walk everyday for half an hour but says that her current neighborhood is not safe. She is interested in stretching 5x a week for about 20 minutes and plans to start that after finding videos online to help her.

## 2019-10-11 ENCOUNTER — Telehealth: Payer: Self-pay

## 2019-10-11 NOTE — Progress Notes (Signed)
Collaborative Care Encounter 10/11/2019   Length of Session 15   Session Number 2   Mode of Consultation Telephone [facility] health coaching   Some recent data might be hidden     Brief primary care behavioral health [facility]) telephone call with patient regarding physical activity (PA) levels, identified by the Physical Activity Vital Sign (PAVS) screening tool conducted today following patient's recent PCP visit. Patient is currently in the action stage of change for PA, as s/he is engaging in some exercise but not meeting guidelines of 150 min/week. Reviewed the PA Manual with patient and used motivational interviewing strategies to identify benefits, barriers, goals, and self-monitoring for increasing current PA levels.  Since our last call patient has been able to successfully find workouts on Youtube for stretching and some strength training for arms and abdomen. She does chair workouts for now. Since our last call she says she has been doing this at least 5x a week for 40 minutes after breakfast. Long term goal is to find a treadmill to walk inside her home.

## 2019-11-01 ENCOUNTER — Telehealth: Payer: Self-pay

## 2019-11-01 NOTE — Progress Notes (Signed)
Collaborative Care Encounter 11/01/2019   Length of Session 15   Session Number 3   Mode of Consultation Telephone T-Care health coaching   Some recent data might be hidden     Brief primary care behavioral health [facility]) telephone call with patient regarding physical activity (PA) levels, identified by the Physical Activity Vital Sign (PAVS) screening tool conducted today following patient's recent PCP visit. Patient is currently in the action stage of change for PA, as s/he is engaging in some exercise but not meeting guidelines of 150 min/week. Reviewed the PA Manual with patient and used motivational interviewing strategies to identify benefits, barriers, goals, and self-monitoring for increasing current PA levels.  Patient has maintained her routine of doing 40 mins of strength training exercises at home through [employer]. Has still not found a treadmill to begin walking. Is interested in finding cardio exercises to at home for now while she finds a treadmill. Her motivation has been that she has felt in less pain than before on arms and legs.

## 2019-11-03 ENCOUNTER — Ambulatory Visit: Payer: Self-pay | Admitting: Internal Medicine

## 2019-11-03 DIAGNOSIS — E1169 Type 2 diabetes mellitus with other specified complication: Secondary | ICD-10-CM

## 2019-11-03 DIAGNOSIS — I10 Essential (primary) hypertension: Secondary | ICD-10-CM

## 2019-11-03 DIAGNOSIS — M549 Dorsalgia, unspecified: Secondary | ICD-10-CM

## 2019-11-03 DIAGNOSIS — M545 Low back pain, unspecified: Secondary | ICD-10-CM

## 2019-11-03 DIAGNOSIS — E039 Hypothyroidism, unspecified: Secondary | ICD-10-CM

## 2019-11-03 DIAGNOSIS — K219 Gastro-esophageal reflux disease without esophagitis: Secondary | ICD-10-CM

## 2019-11-03 DIAGNOSIS — E119 Type 2 diabetes mellitus without complications: Secondary | ICD-10-CM

## 2019-11-03 DIAGNOSIS — L309 Dermatitis, unspecified: Secondary | ICD-10-CM

## 2019-11-03 MED ORDER — ATORVASTATIN CALCIUM 40 MG OR TABS
40.0000 mg | ORAL_TABLET | Freq: Every day | ORAL | 0 refills | Status: DC
Start: 2019-11-03 — End: 2020-02-02

## 2019-11-03 MED ORDER — HYDROCORTISONE 1 % EX CREA
1.0000 | TOPICAL_CREAM | Freq: Two times a day (BID) | CUTANEOUS | 0 refills | Status: DC
Start: 2019-11-03 — End: 2019-11-03

## 2019-11-03 MED ORDER — METFORMIN HCL 1000 MG OR TABS
1000.0000 mg | ORAL_TABLET | Freq: Two times a day (BID) | ORAL | 0 refills | Status: DC
Start: 2019-11-03 — End: 2020-02-02

## 2019-11-03 MED ORDER — FAMOTIDINE 20 MG OR TABS
20.0000 mg | ORAL_TABLET | Freq: Every day | ORAL | 0 refills | Status: DC | PRN
Start: 2019-11-03 — End: 2020-02-02

## 2019-11-03 MED ORDER — LISINOPRIL 40 MG OR TABS
40.0000 mg | ORAL_TABLET | Freq: Every day | ORAL | 0 refills | Status: DC
Start: 2019-11-03 — End: 2020-02-02

## 2019-11-03 MED ORDER — AMLODIPINE 10 MG OR TABS
10.0000 mg | ORAL_TABLET | Freq: Every day | ORAL | 0 refills | Status: DC
Start: 2019-11-03 — End: 2020-02-02

## 2019-11-03 MED ORDER — LEVOTHYROXINE SODIUM 100 MCG OR TABS
100.0000 ug | ORAL_TABLET | Freq: Every day | ORAL | 0 refills | Status: DC
Start: 2019-11-03 — End: 2020-02-02

## 2019-11-03 MED ORDER — HYDROCHLOROTHIAZIDE 25 MG OR TABS
25.0000 mg | ORAL_TABLET | Freq: Every day | ORAL | 0 refills | Status: DC
Start: 2019-11-03 — End: 2020-02-02

## 2019-11-03 MED ORDER — IBUPROFEN 400 MG OR TABS
400.0000 mg | ORAL_TABLET | Freq: Every evening | ORAL | 0 refills | Status: DC
Start: 2019-11-03 — End: 2020-02-02

## 2019-11-03 MED ORDER — ASPIRIN EC 81 MG OR TBEC (CUSTOM)
81.0000 mg | DELAYED_RELEASE_TABLET | Freq: Every day | ORAL | 0 refills | Status: DC
Start: 2019-11-03 — End: 2020-02-02

## 2019-11-03 MED ORDER — SPIRONOLACTONE 25 MG OR TABS
12.5000 mg | ORAL_TABLET | Freq: Every day | ORAL | 0 refills | Status: DC
Start: 2019-11-03 — End: 2020-02-02

## 2019-11-03 MED ORDER — GABAPENTIN 300 MG OR CAPS
300.0000 mg | ORAL_CAPSULE | Freq: Two times a day (BID) | ORAL | 0 refills | Status: DC
Start: 2019-11-03 — End: 2020-02-02

## 2019-11-03 NOTE — Progress Notes (Signed)
[facility] Telemedicine Progress Note     Due to COVID-19 pandemic and a federally declared state of public health  emergency, this service is being conducted via telemedicine.     Patient has multiple co-morbidities that pose  substantial risk if exposed to COVID-19.     Verbal consent obtained for visit via telephone     SUBJECTIVE:  Felicia [name] is a 64 year old female with hx of T2DM, HTN, hypothyroidism, and hyperlipidemia presents for routine follow up.  Chief Complaint   Patient presents with    Diabetes       Patients last visit at [facility] was 09/02/19.    To Do items from last visit were:  -f/u DM BG readings at home (ensure that pt has supplies to check at home); order HgbA1c and BMP with next labs  -f/u lower extremity swelling - order BNP next labs; EKG to r/o HF; review compliance to x2 diuretics  -f/u HTN - review bp log  -f/u mouth ulcers - dental apt. ?  -f/u HCM: pap smear/ mammogram status; shingles/pneumovax     HPI    Pt denies any specific concerns.     #T2D2 (A1c 7.1 08/31/19)   - doesn't take BG b/c doesn't have glucometer.   - Taking Metformin 1000mg  BID, denies N/V/D, abd pain.   - Denies vision changes, polydipsia, numbness/tingling (which she attributes to gabapentin). Last DR screening 2 yrs ago. Foot exam normal 2 mo ago.    - Exercise: Had EIM, exercising 40 min per day at home, uncomfortable walking around neighborhood d/t safety. Considering obtaining a treadmill. Intentional 15 lb weight loss in 08/2019 but has since maintained wieght  - Diet: 2 meals per day, trying to eat more vegetables and avoid sugars/fats.    #HTN  #leg swelling  - amlopdipine 10, lisinopril 40, HCTZ 25, aldactone 12.5   - BP log: SBP max 132 and min 126. Recording BP q2days  - Lower extremity swelling has resolved per pt, which she attributes to exercises. Urinating frequently w/ diuretics.  - Denies CP, SOB, HA, exertional symptoms.    #Mouth ulcers  - Episodic mouth ulcers to mucosal  of inferior lip for 6 years, primarily present in the summer.   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 Plan for dental appointment however still pending.   - Currently ulcers are improved, not currently painful. Not interested in medication at this time.     #hypothyroidism (TSH 2.7 08/31/19)  - Levothyroxine 10/31/19, needs refill, only has 1d left  - denies symptoms currently.     Outpatient Medications Prior to Visit   Medication Sig Dispense Refill    amLODIPINE (NORVASC) 10 MG tablet Take 1 tablet (10 mg) by mouth daily. 90 tablet 0    aspirin 81 MG EC tablet Take 1 tablet (81 mg) by mouth daily. 90 tablet 0    atorvastatin (LIPITOR) 40 MG tablet Take 1 tablet (40 mg) by mouth daily. 90 tablet 0    famotidine (PEPCID) 20 MG tablet Take 1 tablet (20 mg) by mouth daily as needed (gerd). 60 tablet 0    gabapentin (NEURONTIN) 300 MG capsule Take 1 capsule (300 mg) by mouth 2 times daily. 180 capsule 0    hydrochlorothiazide (HYDRODIURIL) 25 MG tablet Take 1 tablet (25 mg) by mouth daily. 90 tablet 0    hydrocortisone 1 % cream Apply 1 Application topically 2 times daily. Apply to affected  area 1 Tube 0    ibuprofen (MOTRIN) 400 MG tablet Take 1 tablet (400 mg) by mouth at bedtime. Take with food. 100 tablet 0    levothyroxine (SYNTHROID) 100 MCG tablet Take 1 tablet (100 mcg) by mouth every morning (before breakfast). 90 tablet 0    lisinopril (PRINIVIL, ZESTRIL) 40 MG tablet Take 1 tablet (40 mg) by mouth daily. 90 tablet 0    metFORMIN (GLUCOPHAGE) 1000 MG tablet Take 1 tablet (1,000 mg) by mouth 2 times daily (with meals). Increase to 1000 mg BID on 08/11/19 due to elevated A1C 180 tablet 0    spironolactone (ALDACTONE) 25 MG tablet Take 0.5 tablets (12.5 mg) by mouth daily. 45 tablet 0     No facility-administered medications prior to visit.     OBJECTIVE:  Physical Exam and VS deferred because telemedicine visit  Well appearing, NAD, speaking in full sentences.     Relevant  Labs:  Lab Results   Component Value Date    A1C 7.1 (H) 08/31/2019     Lab Results   Component Value Date    TSH 2.70 08/31/2019      Lab Results   Component Value Date    NA 135 08/31/2019    K 4.7 08/31/2019    CL 103 08/31/2019    BICARB 28 08/31/2019    BUN 19 08/31/2019    CREAT 0.59 08/31/2019    GLU 116 (H) 08/31/2019    [place] 9.1 08/31/2019       Relevant Studies/Imaging: no new studies.    Assessment and Plan:  Felicia [name] is a 64 year old female seen for routine follow up.    #T2D2 (A1c 7.1 08/31/19)  Still unable to take home BG as she does not have glucometer. Will pick up on Tuesday during med pickup. Currently asymptomatic. Awaiting DR screen w/ referral pending. Foot exam at last visit normal. Doing well with exercise and diet.   - Continue Metformin 1000mg  BID  - Pick up glucometer from clinic  - DR screening pending ophtho scheduling.   - Continue lifestyle modifications. Pt interested in obtaining stationary bicycle.  - A1c and BMP prior to next visit.    #HTN  #leg swelling  Per pt BP log she is doing well on current regimen. Asymptomatic, no hypotensive episodes, syncope, or exertional symptoms. No other red flag symptoms. Her leg swelling as resolved likely d/t adequate diuresis exercise, and equilibration to medication changes.   - Continue amlopdipine 10, lisinopril 40, HCTZ 25, aldactone 12.5   - Continue BP logs, advised to take more frequently. Check in clinic tomorrow along with weight.  - BMP prior to next visit.   - F/u leg swelling at next visit    #Mouth ulcers  See in person visit of 09/02/19 for full details and image. At that time there was concern for HSV stomatitis. Pt reports her symptoms have currently improved now that Summer has ended. She is not having pain now. Discussed option to try acyclovir empirically but pt is not interested at this time.   - f/u mouth ulcers at next visit   - Can consider PCR testing or empiric acyclovir if symptoms reoccur and are bothersome.   -  Dental referral placed and pending.     #hypothyroidism (TSH 2.7 08/31/19)  Currently asymptomatic and on therapeutic dose of levothyroxine. Currently needs refill so she will pickup from clinic tomorrow morning.  - Continue Levothyroxine 10/31/19 and refill tomorrow.  Health Care Maintenance (HCM)  COVID - pt states that she already completed COVID vaccines   Pap smear, mammogram - pt has paperwork but it is in english so she will bring to clinic on Tuesday during med pickup for help.   FOBT - last done 09/02/19  Pneumovax, shingles vaccines- needs, pending pharm  Diabetes-specific HCM as appropriate: Foot exam 09/02/19. DR screening awaiting ophtho scheduling (referral placed). Neg proteinuria screen 06/2019      PAVS Total Activity: 160 minutes/week minutes/week   I have counseled Ms. Todorov on improving her level of exercise in the following way:     Advised patient to engage in physical activity as recommended in the patient education handout.     20 minutes were spent with patient on the phone and additional time was spent coordinating labs, medications and follow up.    Next appointment: 02/02/19    To do items at next visit:  - DM: f/u home BG (should have glucometer now), weight, A1c, ophtho referral for DR screening  - HTN: f/u BP log, lower extremity edema, and BMP  - mouth ulcers: f/u symptoms, dental referral  - HCM: f/u pap/mammo (did she resolve paperwork issues), f/u pneumovax/shingles    Dorian Heckle, MS4  DWA Dr. Louann Sjogren

## 2019-11-10 ENCOUNTER — Ambulatory Visit: Payer: Self-pay | Admitting: Ophthalmology

## 2019-11-10 DIAGNOSIS — Z135 Encounter for screening for eye and ear disorders: Secondary | ICD-10-CM

## 2019-11-10 NOTE — Progress Notes (Signed)
[facility] Special[facility] Clinic Note  Felicia [name]  1955-08-03  [id number]  11/10/19      Reason for Referral:    SUBJECTIVE::Felicia [name] is a 64 year old female with h/o HTN, T2DM here for diabetic retinopathy screening. Last A1c was 7.1 08/31/19. Pt does have glasses that she uses for near and far vision.     Past Medical History  Past Medical History:   Diagnosis Date    Back pain at L4-L5 level 11/2014    Diabetes mellitus type 2, controlled, without complications (CMS-HCC)     GERD (gastroesophageal reflux disease)     Hyperlipidemia associated with type 2 diabetes mellitus (CMS-HCC)     Hypertension     Hypothyroidism     Obesity        Medications/Allergies:     Current Outpatient Medications   Medication Sig    amLODIPINE (NORVASC) 10 MG tablet Take 1 tablet (10 mg) by mouth daily.    aspirin 81 MG EC tablet Take 1 tablet (81 mg) by mouth daily.    atorvastatin (LIPITOR) 40 MG tablet Take 1 tablet (40 mg) by mouth daily.    famotidine (PEPCID) 20 MG tablet Take 1 tablet (20 mg) by mouth daily as needed (gerd).    gabapentin (NEURONTIN) 300 MG capsule Take 1 capsule (300 mg) by mouth 2 times daily.    hydrochlorothiazide (HYDRODIURIL) 25 MG tablet Take 1 tablet (25 mg) by mouth daily.    ibuprofen (MOTRIN) 400 MG tablet Take 1 tablet (400 mg) by mouth at bedtime. Take with food.    levothyroxine (SYNTHROID) 100 MCG tablet Take 1 tablet (100 mcg) by mouth every morning (before breakfast).    lisinopril (PRINIVIL, ZESTRIL) 40 MG tablet Take 1 tablet (40 mg) by mouth daily.    metFORMIN (GLUCOPHAGE) 1000 MG tablet Take 1 tablet (1,000 mg) by mouth 2 times daily (with meals). Increase to 1000 mg BID on 08/11/19 due to elevated A1C    spironolactone (ALDACTONE) 25 MG tablet Take 0.5 tablets (12.5 mg) by mouth daily.     No current facility-administered medications for this visit.       No Known Allergies    Family History   Problem Relation Name Age of Onset    Cancer  Other      Diabetes Other      Hypertension Mother           Exam    Vision          Distance: OD 20/40  Cc  OS 20/40      Pupillary Reflex: PERRL    EOM: EOMI  CVF: normal    IOP (tono/app)  OD 6  OS 16    Dilated Fundus Exam  Disc:  pink/sharp/flat OU, no neovascularization     Macula : flat, no exudates/hemorrhage or edema OU   Vessels: within normal limits OU   Periphery: within normal limits OU, no diabetic retinopathy            Assessment/Plan:  Pt has fairly well controlled A1c at 7.1. Normal fundoscopic exam w/o evidence of diabetic retinopathy. Recommend good BP/BG control. Will see back in 1-2 years for DR screening.       Attending: Westley Chandler, MD  Note written by medical student, Bartholomew Boards, MS2

## 2019-11-30 ENCOUNTER — Other Ambulatory Visit: Payer: Self-pay | Admitting: Family Medicine

## 2019-12-01 LAB — BASIC METABOLIC PANEL, BLOOD
BUN: 19 mg/dL (ref 7–25)
Calcium: 9.5 mg/dL (ref 8.6–10.4)
Carbon Dioxide: 28 mmol/L (ref 20–32)
Chloride: 102 mmol/L (ref 98–110)
Creatinine: 0.63 mg/dL (ref 0.50–0.99)
Glucose: 99 mg/dL (ref 65–99)
Potassium: 5.2 mmol/L (ref 3.5–5.3)
Sodium: 134 mmol/L — ABNORMAL LOW (ref 135–146)
eGFR African American: 108 mL/min/{1.73_m2} (ref >=60)
eGFR non-Afr.American: 93 mL/min/{1.73_m2} (ref >=60)

## 2019-12-01 LAB — GLYCOSYLATED HGB(A1C), BLOOD: Hgb A1C: 5.9 % of total Hgb — ABNORMAL HIGH (ref <5.7)

## 2020-02-02 ENCOUNTER — Ambulatory Visit: Payer: Self-pay | Admitting: Internal Medicine

## 2020-02-02 DIAGNOSIS — I10 Essential (primary) hypertension: Secondary | ICD-10-CM

## 2020-02-02 DIAGNOSIS — K219 Gastro-esophageal reflux disease without esophagitis: Secondary | ICD-10-CM

## 2020-02-02 DIAGNOSIS — M545 Low back pain, unspecified: Secondary | ICD-10-CM

## 2020-02-02 DIAGNOSIS — M5430 Sciatica, unspecified side: Secondary | ICD-10-CM

## 2020-02-02 DIAGNOSIS — Z789 Other specified health status: Secondary | ICD-10-CM

## 2020-02-02 DIAGNOSIS — E039 Hypothyroidism, unspecified: Secondary | ICD-10-CM

## 2020-02-02 DIAGNOSIS — E1169 Type 2 diabetes mellitus with other specified complication: Secondary | ICD-10-CM

## 2020-02-02 DIAGNOSIS — M549 Dorsalgia, unspecified: Secondary | ICD-10-CM

## 2020-02-02 DIAGNOSIS — E119 Type 2 diabetes mellitus without complications: Secondary | ICD-10-CM

## 2020-02-02 MED ORDER — ATORVASTATIN CALCIUM 40 MG OR TABS
40.0000 mg | ORAL_TABLET | Freq: Every day | ORAL | 0 refills | Status: DC
Start: 2020-02-02 — End: 2020-04-26

## 2020-02-02 MED ORDER — SPIRONOLACTONE 25 MG OR TABS
12.5000 mg | ORAL_TABLET | Freq: Every day | ORAL | 0 refills | Status: DC
Start: 2020-02-02 — End: 2020-04-26

## 2020-02-02 MED ORDER — GABAPENTIN 300 MG OR CAPS
300.0000 mg | ORAL_CAPSULE | Freq: Two times a day (BID) | ORAL | 0 refills | Status: DC
Start: 2020-02-02 — End: 2020-04-26

## 2020-02-02 MED ORDER — METFORMIN HCL 1000 MG OR TABS
1000.0000 mg | ORAL_TABLET | Freq: Two times a day (BID) | ORAL | 0 refills | Status: DC
Start: 2020-02-02 — End: 2020-04-26

## 2020-02-02 MED ORDER — AMLODIPINE 10 MG OR TABS
10.0000 mg | ORAL_TABLET | Freq: Every day | ORAL | 0 refills | Status: DC
Start: 2020-02-02 — End: 2020-04-26

## 2020-02-02 MED ORDER — IBUPROFEN 400 MG OR TABS
400.0000 mg | ORAL_TABLET | Freq: Every evening | ORAL | 0 refills | Status: DC
Start: 2020-02-02 — End: 2020-04-26

## 2020-02-02 MED ORDER — HYDROCHLOROTHIAZIDE 25 MG OR TABS
25.0000 mg | ORAL_TABLET | Freq: Every day | ORAL | 0 refills | Status: DC
Start: 2020-02-02 — End: 2020-04-26

## 2020-02-02 MED ORDER — LEVOTHYROXINE SODIUM 100 MCG OR TABS
100.0000 ug | ORAL_TABLET | Freq: Every day | ORAL | 0 refills | Status: DC
Start: 2020-02-02 — End: 2020-04-26

## 2020-02-02 MED ORDER — FAMOTIDINE 20 MG OR TABS
20.0000 mg | ORAL_TABLET | Freq: Every day | ORAL | 0 refills | Status: DC | PRN
Start: 2020-02-02 — End: 2020-04-26

## 2020-02-02 MED ORDER — ASPIRIN EC 81 MG OR TBEC (CUSTOM)
81.0000 mg | DELAYED_RELEASE_TABLET | Freq: Every day | ORAL | 0 refills | Status: DC
Start: 2020-02-02 — End: 2020-04-26

## 2020-02-02 MED ORDER — LISINOPRIL 40 MG OR TABS
40.0000 mg | ORAL_TABLET | Freq: Every day | ORAL | 0 refills | Status: DC
Start: 2020-02-02 — End: 2020-04-26

## 2020-02-02 NOTE — Progress Notes (Cosign Needed)
[facility] Telemedicine Progress Note     Due to COVID-19 pandemic and a federally declared state of public health emergency, this service is being conducted via telemedicine.     Patient has multiple co-morbidities that pose substantial risk if exposed to COVID-19.     Verbal consent obtained for visit via telephone    SUBJECTIVE:    Felicia [name] is a 65 year old female with with hx of T2DM, HTN, hypothyroidism, and hyperlipidemia presents for routine follow up       Chief Complaint   Patient presents with    Diabetes       Patients last visit at [facility] was 11/03/19.    To Do items from last visit were:  - DM: f/u home BG (should have glucometer now), weight, A1c, ophtho referral for DR screening  - HTN: f/u BP log, lower extremity edema, and BMP  - mouth ulcers: f/u symptoms, dental referral  - HCM: f/u pap/mammo (did she resolve paperwork issues), f/u pneumovax/shingles    HPI  Overall doing well.     #T2D2 (A1c 5.9 11/30/19)   - Still does not have glucometer   - Taking Metformin 1063m BID  - Screenings UTD       #HTN  #leg swelling  - amlopdipine 10, lisinopril 40, HCTZ 25, aldactone 12.5   - BP log: SBP highs include 159,140, 141 - sometimes in the 130's Recording BP q3days.   - Denies SOB, HA, leg swelling; Endorsed chest pain today (noted below)  - Missed exercise appointment but talked with IMonia Pouchfor videos for older adults and that's helped her swelling    #Chest pain   - WMartin Majesticout with husband while cleaning and cold at 8PM and by 12:30 in the evening it came better   - Got chest pain and tightness - first time with these symptoms  - Denies light headedness and palpitation  - Took blood pressure at the time and it was good  - Better with vapor rub and cold socks per patient, confirmed better with rest    #Mouth ulcers  - Episodic mouth ulcers to mucosal of inferior lip for 6 years, primarily present in the summer.   - In person visit in 08/2019, thought to by HSV1 aphthous  stomatitis. Plan for dental appointment however still pending.   - Currently ulcers are improved, not currently painful. Not interested in medication at this time.      #hypothyroidism (TSH 2.7 08/31/19)  - Levothyroxine 1030m  - denies changes in her hair, skin, or nails, and denies any weakness, fatigue, diarrhea, constipation, or changes in mood.     Needs assessment for patient health during Covid 19 pandemic (OK TO DELETE IF COVERED PREVIOUSLY)  Covid 19 related issues:   Vaccinated with booster yet? Has both doses + Booster in December (PTherapist, music If not, does the patient have any questions or concerns about the vaccine?  Give myturn.caForexFest.nos a resource for patient to get vaccinated  Does your Pt have active emergency medi-cal? Has your patient heard of the NEW medi[place]cal law, which is [email or web address] for [place] (regardless of legal status). Refer to social work with questions   Any concerns about emotional well being?  Can refer to support group which is every Tuesday 9-10:30 (message EsChristean Leafn secure chat for info) or psychology.  Resources for nutrition from free clinic can be found https://ucsdnkp.weebly.com/   Resources for exercise (  use exercise questions in "rooming" tab and offer behavior coach for follow up) https://www.villanueva.com/.php/resources/       Outpatient Medications Prior to Visit   Medication Sig Dispense Refill    amLODIPINE (NORVASC) 10 MG tablet Take 1 tablet (10 mg) by mouth daily. 100 tablet 0    aspirin 81 MG EC tablet Take 1 tablet (81 mg) by mouth daily. 100 tablet 0    atorvastatin (LIPITOR) 40 MG tablet Take 1 tablet (40 mg) by mouth daily. 100 tablet 0    famotidine (PEPCID) 20 MG tablet Take 1 tablet (20 mg) by mouth daily as needed (gerd). 60 tablet 0    gabapentin (NEURONTIN) 300 MG capsule Take 1 capsule (300 mg) by mouth 2 times daily. 200 capsule 0    hydrochlorothiazide (HYDRODIURIL) 25 MG tablet Take 1 tablet (25  mg) by mouth daily. 100 tablet 0    ibuprofen (MOTRIN) 400 MG tablet Take 1 tablet (400 mg) by mouth at bedtime. Take with food. 100 tablet 0    levothyroxine (SYNTHROID) 100 MCG tablet Take 1 tablet (100 mcg) by mouth every morning (before breakfast). 100 tablet 0    lisinopril (PRINIVIL, ZESTRIL) 40 MG tablet Take 1 tablet (40 mg) by mouth daily. 100 tablet 0    metFORMIN (GLUCOPHAGE) 1000 MG tablet Take 1 tablet (1,000 mg) by mouth 2 times daily (with meals). Increase to 1000 mg BID on 08/11/19 due to elevated A1C 200 tablet 0    spironolactone (ALDACTONE) 25 MG tablet Take 0.5 tablets (12.5 mg) by mouth daily. 50 tablet 0     No facility-administered medications prior to visit.       OBJECTIVE:  Physical Exam and VS deferred because telemedicine visit    Relevant Labs: A1C 5.9 (12/10/19), BMP wnl    Relevant Studies/Imaging: N/A    Assessment and Plan:    Felicia [name] is a 65 year old female with hx of T2DM, HTN, hypothyroidism, and hyperlipidemia presents for routine follow up.     #T2DM  - Continue Metformin 1046m BID  - Defer getting glucometer, can repeat A1C 6 months from 11/30/19.  - Continue lifestyle modifications.      #HTN  #leg swelling  - Continue amlopdipine 10, lisinopril 40, HCTZ 25, aldactone 12.5   - Continue BP logs, advised to take more frequently.   - CTM leg swelling at next visit given amlodipine can cause leg swelling     #Mouth ulcers  Per last note See in person visit of 09/02/19 for full details and image. At that time there was concern for HSV stomatitis. Pt reports her symptoms have currently improved now that Summer has ended. She is not having pain now. Discussed option to try acyclovir empirically but pt is not interested at this time.   - f/u mouth ulcers at next visit   - Can consider PCR testing or empiric acyclovir if symptoms reoccur and are bothersome.       #hypothyroidism (TSH 2.7 08/31/19)  Currently asymptomatic and on therapeutic dose of levothyroxine.  - Continue  Levothyroxine 1094m     Health Care Maintenance (HCM)  COVID -  Completed + booster  Colon cancer - last done 09/02/19  Mammogram/PAP if appropriate: In past trying to obtain forms (given in Eng), [place] [place] [facility] [phone] - Patient told she would receive pap form in Spanish when picking  up food   - Patient will look for name of mammogram clinic and bring to clinic on Tuesday and ask for medical release form to ask for mammogram information. Also told to obtain copy from clinic if there are issus with the authorization of medical release form  Pneumovax/Tdap/Shingles - UTD Tdap, forms will be given for Pneumovax/shingles  Diabetes-specific HCM as appropriate: Foot exam 09/02/19. DR screening 11/10/19 (normal, return 1-2 yrs). Neg proteinuria screen 06/2019     PAVS Total Activity: 280 minutes/week minutes/week   I have counseled Ms. Holston on improving her level of exercise in the following way:     Advised patient to engage in physical activity as recommended in the patient education handout.     70 minutes were spent with patient on the phone and additional time was spent coordinating labs, medications and follow up.    Next appointment:     To do items at next visit:  -f/u diet and exercise for DM and HTN  -f/u leg swelling, mouth pain, if leg lower extremity swelling worsens - consider order BNP next labs (BMP ordered in error?); EKG to r/o HF; review compliance to x2 diuretics  -f/u HTN - review bp log  -f/u if patient received dental care  -f/u HCM: pap smear/ mammogram status; shingles/pneumovax     Patient seen and discussed with Dr. Lindie Spruce, MS4   [place]    Resident addendum:   I discussed and met with patient over video visit. I agree with the medical student's assessment and plan and have made edits to the above note as  necessary[place] [id number]  PGY-2, Family Medicine  Pager: [phone]

## 2020-03-27 ENCOUNTER — Other Ambulatory Visit: Payer: Self-pay

## 2020-03-28 NOTE — Progress Notes (Signed)
Patient here for shingles vaccine. Screening questionnaire completed and reviewed. Vaccine administered and documented

## 2020-04-26 ENCOUNTER — Ambulatory Visit: Payer: Self-pay | Admitting: Family Medicine

## 2020-04-26 DIAGNOSIS — E119 Type 2 diabetes mellitus without complications: Secondary | ICD-10-CM

## 2020-04-26 DIAGNOSIS — E1169 Type 2 diabetes mellitus with other specified complication: Secondary | ICD-10-CM

## 2020-04-26 DIAGNOSIS — E785 Hyperlipidemia, unspecified: Secondary | ICD-10-CM

## 2020-04-26 DIAGNOSIS — I10 Essential (primary) hypertension: Secondary | ICD-10-CM

## 2020-04-26 DIAGNOSIS — M5430 Sciatica, unspecified side: Secondary | ICD-10-CM

## 2020-04-26 DIAGNOSIS — E039 Hypothyroidism, unspecified: Secondary | ICD-10-CM

## 2020-04-26 DIAGNOSIS — M545 Low back pain, unspecified: Secondary | ICD-10-CM

## 2020-04-26 DIAGNOSIS — M549 Dorsalgia, unspecified: Secondary | ICD-10-CM

## 2020-04-26 DIAGNOSIS — K219 Gastro-esophageal reflux disease without esophagitis: Secondary | ICD-10-CM

## 2020-04-26 MED ORDER — ATORVASTATIN CALCIUM 40 MG OR TABS
40.0000 mg | ORAL_TABLET | Freq: Every day | ORAL | 0 refills | Status: DC
Start: 2020-04-26 — End: 2020-07-19

## 2020-04-26 MED ORDER — FAMOTIDINE 20 MG OR TABS
20.0000 mg | ORAL_TABLET | Freq: Every day | ORAL | 0 refills | Status: DC | PRN
Start: 2020-04-26 — End: 2020-04-26

## 2020-04-26 MED ORDER — ASPIRIN EC 81 MG OR TBEC (CUSTOM)
81.0000 mg | DELAYED_RELEASE_TABLET | Freq: Every day | ORAL | 0 refills | Status: DC
Start: 2020-04-26 — End: 2020-07-19

## 2020-04-26 MED ORDER — METFORMIN HCL 1000 MG OR TABS
1000.0000 mg | ORAL_TABLET | Freq: Two times a day (BID) | ORAL | 0 refills | Status: DC
Start: 2020-04-26 — End: 2020-07-19

## 2020-04-26 MED ORDER — IBUPROFEN 400 MG OR TABS
400.0000 mg | ORAL_TABLET | Freq: Every evening | ORAL | 0 refills | Status: DC
Start: 2020-04-26 — End: 2020-07-19

## 2020-04-26 MED ORDER — AMLODIPINE 10 MG OR TABS
10.0000 mg | ORAL_TABLET | Freq: Every day | ORAL | 0 refills | Status: DC
Start: 2020-04-26 — End: 2020-07-19

## 2020-04-26 MED ORDER — SPIRONOLACTONE 25 MG OR TABS
12.5000 mg | ORAL_TABLET | Freq: Every day | ORAL | 0 refills | Status: DC
Start: 2020-04-26 — End: 2020-07-19

## 2020-04-26 MED ORDER — GABAPENTIN 300 MG OR CAPS
300.0000 mg | ORAL_CAPSULE | Freq: Two times a day (BID) | ORAL | 0 refills | Status: DC
Start: 2020-04-26 — End: 2020-04-26

## 2020-04-26 MED ORDER — LEVOTHYROXINE SODIUM 100 MCG OR TABS
100.0000 ug | ORAL_TABLET | Freq: Every day | ORAL | 0 refills | Status: DC
Start: 2020-04-26 — End: 2020-07-19

## 2020-04-26 MED ORDER — LISINOPRIL 40 MG OR TABS
40.0000 mg | ORAL_TABLET | Freq: Every day | ORAL | 0 refills | Status: DC
Start: 2020-04-26 — End: 2020-07-19

## 2020-04-26 MED ORDER — HYDROCHLOROTHIAZIDE 25 MG OR TABS
25.0000 mg | ORAL_TABLET | Freq: Every day | ORAL | 0 refills | Status: DC
Start: 2020-04-26 — End: 2020-07-19

## 2020-04-26 MED ORDER — CALCIUM CARBONATE ANTACID 500 MG OR CHEW
500.0000 mg | CHEWABLE_TABLET | Freq: Two times a day (BID) | ORAL | 0 refills | Status: DC | PRN
Start: 2020-04-26 — End: 2020-07-19

## 2020-04-26 MED ORDER — GABAPENTIN 300 MG OR CAPS
300.0000 mg | ORAL_CAPSULE | Freq: Every evening | ORAL | 0 refills | Status: DC | PRN
Start: 2020-04-26 — End: 2020-07-19

## 2020-04-26 NOTE — Progress Notes (Cosign Needed)
[place] [facility] Telemedicine Progress Note     Due to COVID-19 pandemic and a federally declared state of public health  emergency, this service is being conducted via telemedicine.     Patient has multiple co-morbidities that pose  substantial risk if exposed to COVID-19.     Verbal consent obtained for visit via telephone  SUBJECTIVE:    Felicia [name] is a 65 year old female with T2DM, HTN, HLD, and chronic back pain and GERD,   Chief Complaint   Patient presents with   . Hypertension       Patients last visit at [facility] was 02/02/2020.    To Do items from last visit were:  F/u diet and exercise for DM and HTN  F/u leg swelling  F/u mouth ulcers  F/u HTN, bp log  F/u dental  F/u pap smear/mammogram/shingles/pneumovax    HPI  #HTN and lower extremity edema  Has been taking a few blood pressure measurements. Throughout the month of March: 134/48, 123/57, 145/65, 129/51, 135/55, 133/50, 121/63. Lower leg edema has decreased significantly, as well as the pain. There is still pain, but is much less compared to before. Can walk for 1 hour without stopping. Patient denies chest pain, orthopnea, DoE.   #Diet and exercise: A dr. Recommended exercises to reduce leg edema on Youtube. These have helped a lot. No changes in diet. Tries to "eat well." Last home weight measurement (used to weigh 192#) 189# at this time.    #mouth sores   Usually comes around the warmer months. Does not want treatment at this time. Patient will remain indoors to limit sun exposure as she associates it with increased mouth sores.    #GERD. No longer feels GERD. Does not take famotidine every day. Reflux symptoms happens around 3x a month.     #Backpain/ sciatica. Back pain has decreased a lot, since starting exercise. Patient has stopped taking gabapentin nightly, decreasing to about 3 times a week. Patient continues to take ibuprofen 400mg  most nights, but reports that she takes it because it makes her feel better mentally  "calms her down". She always takes ibuprofen on the nights that she takes gabapentin. The nights with more pain are associated with days that she does not exercise.    #pap smear/mammogram. Patient will bring in results on April 4 for mammogram and pap smear    #shingles. Patient recently got shingles vax on Mar 27, 2020    #Referrals. Contacted by dentists. Went to go see them last Wednesday, march 23. Deep clean.    #T2DM. No changes in diet at this time. Weight decreasing slightly    #hypothyroidism. Denies changes in hair, skin, nails. Denies weakness, fatigue, diarrhea, or changes in mood.    Outpatient Medications Prior to Visit   Medication Sig Dispense Refill   . amLODIPINE (NORVASC) 10 MG tablet Take 1 tablet (10 mg) by mouth daily. 90 tablet 0   . aspirin 81 MG EC tablet Take 1 tablet (81 mg) by mouth daily. 90 tablet 0   . atorvastatin (LIPITOR) 40 MG tablet Take 1 tablet (40 mg) by mouth daily. 90 tablet 0   . famotidine (PEPCID) 20 MG tablet Take 1 tablet (20 mg) by mouth daily as needed (gerd). 60 tablet 0   . gabapentin (NEURONTIN) 300 MG capsule Take 1 capsule (300 mg) by mouth 2 times daily. 180 capsule 0   . hydrochlorothiazide (HYDRODIURIL) 25 MG tablet Take 1 tablet (25 mg) by  mouth daily. 90 tablet 0   . ibuprofen (MOTRIN) 400 MG tablet Take 1 tablet (400 mg) by mouth at bedtime. Take with food. 100 tablet 0   . levothyroxine (SYNTHROID) 100 MCG tablet Take 1 tablet (100 mcg) by mouth every morning (before breakfast). 90 tablet 0   . lisinopril (PRINIVIL, ZESTRIL) 40 MG tablet Take 1 tablet (40 mg) by mouth daily. 90 tablet 0   . metFORMIN (GLUCOPHAGE) 1000 mg tablet Take 1 tablet (1,000 mg) by mouth 2 times daily (with meals). 180 tablet 0   . spironolactone (ALDACTONE) 25 MG tablet Take 0.5 tablets (12.5 mg) by mouth daily. 45 tablet 0     No facility-administered medications prior to visit.       OBJECTIVE:  Physical Exam and VS deferred because telemedicine visit    Relevant Labs: A1c 5.9  11/21, TSH 2.7 8/21    Assessment and Plan:    Felicia [name] is a 65 year old female seen for hypertension and med refills. She's overall doing much better and it may be largely attributed to consistent exercise.    #HTN and lower extremity edema  Previous notes were cautious for HF due to persistent LE edema. As reported by the patient, edema and LE pain as decreased significantly since starting exercise. Decreases in edema may be due to less gabapentin use. CTM for any return of LE edema, chest pain.   #Diet and exercise. Continue to encourage     #mouth sores. CTM. Patient learned of antiviral medications and will report if desired.    #GERD. As patient had intermittent GERD and was not taking famotidine regularly, we are swapping it with TUMS 500mg  PRN  -d/c famotidine 20mg  daily  -start calcium carbonate 500mg  PRN    #Backpain/ sciatica. This is no longer a major complaint, and patient attributes it to additional stretching and exercise. Patient takes gabapentin 300mg  3 times a week, along with ibuprofen 400mg  on the same nights.    -decrease gabapentin 300mg  to qHS, PRN for back pain  -continue ibuprofen 400mg  PRN  -also encouraged more days with exercise to decrease med use    #pap smear/mammogram completed 11/2019, records to be brought.    #Referrals: allUTD    #T2DM. Despite no changes in diet, patient's increased exercises and better control of LE edema is allowing patient to lose weight. We'll continue to monitor the patient and encourage continued weight loss.  -A1c ordered  -Encouraged diet improvements and increases exercise.    Health Care Maintenance (HCM)  Colon cancer: neg on 09/02/19  Covid: completed and booster  Mammogram/PAP if appropriate: to be brought on April 4, next clinic for pickup of meds.  Pneumovax/Tdap/Shingles vaccines as needed :Shingles 03/27/2020. PAP for pneumovax given today.  Diabetes-specific HCM as appropriate: per last note:Foot exam 09/02/19. DR screening 11/10/19  (normal, return 1-2 yrs). Neg proteinuria screen 06/2019    30 minutes were spent with patient on the phone and additional time was spent coordinating labs, medications and follow up.    Next appointment: July 19, 2020      To do items at next visit:  -f/u exercise regimen, weight loss  -f/u htn and LE edema  -A1c and T2DM

## 2020-05-01 ENCOUNTER — Other Ambulatory Visit: Payer: Self-pay

## 2020-05-04 ENCOUNTER — Encounter: Payer: Self-pay | Admitting: Family Medicine

## 2020-05-06 NOTE — Progress Notes (Signed)
Patient picked up medication in-person.

## 2020-07-12 ENCOUNTER — Other Ambulatory Visit: Payer: Self-pay | Admitting: Family Medicine

## 2020-07-13 LAB — GLYCOSYLATED HGB(A1C), BLOOD: Hgb A1C: 6.2 % of total Hgb — ABNORMAL HIGH (ref <5.7)

## 2020-07-19 ENCOUNTER — Ambulatory Visit: Payer: Self-pay | Admitting: Family Medicine

## 2020-07-19 DIAGNOSIS — E119 Type 2 diabetes mellitus without complications: Secondary | ICD-10-CM

## 2020-07-19 DIAGNOSIS — I10 Essential (primary) hypertension: Secondary | ICD-10-CM

## 2020-07-19 DIAGNOSIS — E785 Hyperlipidemia, unspecified: Secondary | ICD-10-CM

## 2020-07-19 DIAGNOSIS — M5430 Sciatica, unspecified side: Secondary | ICD-10-CM

## 2020-07-19 DIAGNOSIS — E039 Hypothyroidism, unspecified: Secondary | ICD-10-CM

## 2020-07-19 DIAGNOSIS — M545 Low back pain, unspecified: Secondary | ICD-10-CM

## 2020-07-19 DIAGNOSIS — M549 Dorsalgia, unspecified: Secondary | ICD-10-CM

## 2020-07-19 DIAGNOSIS — K219 Gastro-esophageal reflux disease without esophagitis: Secondary | ICD-10-CM

## 2020-07-19 DIAGNOSIS — Z723 Lack of physical exercise: Secondary | ICD-10-CM

## 2020-07-19 DIAGNOSIS — E1169 Type 2 diabetes mellitus with other specified complication: Secondary | ICD-10-CM

## 2020-07-19 MED ORDER — SPIRONOLACTONE 25 MG OR TABS
12.5000 mg | ORAL_TABLET | Freq: Every day | ORAL | 0 refills | Status: AC
Start: 2020-07-19 — End: ?

## 2020-07-19 MED ORDER — CALCIUM CARBONATE ANTACID 500 MG OR CHEW
500.0000 mg | CHEWABLE_TABLET | Freq: Two times a day (BID) | ORAL | 0 refills | Status: AC | PRN
Start: 2020-07-19 — End: ?

## 2020-07-19 MED ORDER — ASPIRIN EC 81 MG OR TBEC (CUSTOM)
81.0000 mg | DELAYED_RELEASE_TABLET | Freq: Every day | ORAL | 0 refills | Status: AC
Start: 2020-07-19 — End: ?

## 2020-07-19 MED ORDER — LEVOTHYROXINE SODIUM 100 MCG OR TABS
100.0000 ug | ORAL_TABLET | Freq: Every day | ORAL | 0 refills | Status: AC
Start: 2020-07-19 — End: ?

## 2020-07-19 MED ORDER — GABAPENTIN 300 MG OR CAPS
300.0000 mg | ORAL_CAPSULE | Freq: Every evening | ORAL | 0 refills | Status: AC | PRN
Start: 2020-07-19 — End: 2020-10-17

## 2020-07-19 MED ORDER — LISINOPRIL 40 MG OR TABS
40.0000 mg | ORAL_TABLET | Freq: Every day | ORAL | 0 refills | Status: AC
Start: 2020-07-19 — End: ?

## 2020-07-19 MED ORDER — METFORMIN HCL 1000 MG OR TABS
1000.0000 mg | ORAL_TABLET | Freq: Two times a day (BID) | ORAL | 0 refills | Status: AC
Start: 2020-07-19 — End: ?

## 2020-07-19 MED ORDER — AMLODIPINE 10 MG OR TABS
10.0000 mg | ORAL_TABLET | Freq: Every day | ORAL | 0 refills | Status: AC
Start: 2020-07-19 — End: ?

## 2020-07-19 MED ORDER — HYDROCHLOROTHIAZIDE 25 MG OR TABS
25.0000 mg | ORAL_TABLET | Freq: Every day | ORAL | 0 refills | Status: AC
Start: 2020-07-19 — End: ?

## 2020-07-19 MED ORDER — ATORVASTATIN CALCIUM 40 MG OR TABS
40.0000 mg | ORAL_TABLET | Freq: Every day | ORAL | 0 refills | Status: AC
Start: 2020-07-19 — End: ?

## 2020-07-19 MED ORDER — IBUPROFEN 400 MG OR TABS
400.0000 mg | ORAL_TABLET | Freq: Every evening | ORAL | 0 refills | Status: AC
Start: 2020-07-19 — End: ?

## 2020-07-19 NOTE — Progress Notes (Signed)
[facility] Resident note    Please see medical student note for additional details.     In brief, this is a 65 yo with a history of HTN, T2DM, HLD, GERD and chronic back pain here for routine follow-up. Of note, recently received Medi-Cal and establishing care with PCP tomorrow. Will follow up with [place] in a month, otherwise may transition her care to this new provider. She will call our clinic to cancel if so.     #Bumps in mouth: "Fleshy thickenings" intermittently noted over the past year, most notable with cold food. Non-painful. Not bothersome. No dysphagia/throat/tongue swelling. Ddx includes mucocele vs fibroma. Less likely HSV without pain/ulceration. Less likely malignancy due to location, but would want in person visit for exam to determine if biopsy necessary.      #Desire for continued weight loss: discussed exercise, and dietary changes. Referred to exercise coach.    #T2DM: 6.2 (07/12/20). At goal. Continue Metformin 1000mg  BID and lifestyle changes.     #HTN: at goal. Takes Bps at home; systolics of 110s. Continue on current meds (amlodipine, HCTZ, Spirinolactone, lisinopril). Previously noted bilateral LE edema resolved with exercises. Potentially secondary to amlodipine.     #Hypothyroidism: No symptoms of hyper/hypothryoidism (TSH:2.7, 08/2020). Can repeat TSH with routine labs at next visit.     #HCM:  Imms: needs shingrix 2nd dose/covid 3rd dose (through MediCal)  Dexa: Needs (through MediCal)   Mammogram/Pap: negative 11/2019 per patient report at clinic in [facility]. Will bring records to next visit.   Colon cancer screening: FOBT negative 2021 (August). Will give card at next in person visit.     09-22-2001, MD  Internal Medicine/Pediatrics PGY2  [place]  Pager [phone]

## 2020-07-19 NOTE — Progress Notes (Signed)
[facility] Telemedicine Progress Note     Due to COVID-19 pandemic and a federally declared state of public health  emergency, this service is being conducted via telemedicine.     Patient has multiple co-morbidities that pose  substantial risk if exposed to COVID-19.     Verbal consent obtained for visit via telephone    SUBJECTIVE:    Felicia [name] is a 65 year old female with   Chief Complaint   Patient presents with   . Medication Follow-up       Patients last visit at [facility] was 04/26/2020.    To Do items from last visit were:  -f/u exercise regimen, weight loss  -f/u htn and LE edema  -A1c and T2DM    HPI    Feeling well overall with no acute complaints. Seen today for medication refill and f/u home blood pressures    #Blood pressures at home have ranged from 147/59 in April, and 103/50 in May, most recently stable around the 110's.    #Exercise and weight loss  Initially weighed 196, has lost 16lbs, then regained it in seven months. She has not been walking anymore because she's been very busy, traveling a lot between visiting her children. Now that her traveling has calmed down, she intends on exercising much more frequently. However, she is nervous about walking around her neighborhood because she feels it can become dangerous for her. Despite difficulties walking, she has been able to continue chair-based exercises that have significantly helped her back pain. Eating a lot of greens, some chicken, pumpkin and other squash, has cut down on rice. Intermittent constipation which has been worsening over the last several months.    #Lower extremity edema  Significant improvement with chair exercises and medications, no longer bothersome.    #Mouth lesions  Fleshy, non-painful, non-ulcerative mouth lesions that were bothersome to her in summer 2021. They have come back and improve significantly when she sucks on ice cubes and are only noticeable in the heat. She presently says they  are not bothersome to her.    Received a program in the female called 50 more - will be transferring care to a [facility] which will take care of her primary care via Medi-Cal.    Otherwise denies fevers, chills, myalgias, arthralgias, fatigue, night-sweats, skin changes/rash, chest pain, dyspnea, lightheadedness, palpitations, pre-syncope, dysuria, frequency, urgency, or other symptoms.    Outpatient Medications Prior to Visit   Medication Sig Dispense Refill   . amLODIPINE (NORVASC) 10 MG tablet Take 1 tablet (10 mg) by mouth daily. 90 tablet 0   . aspirin 81 MG EC tablet Take 1 tablet (81 mg) by mouth daily. 90 tablet 0   . atorvastatin (LIPITOR) 40 MG tablet Take 1 tablet (40 mg) by mouth daily. 90 tablet 0   . calcium carbonate (TUMS) 500 MG tablet Take 1 tablet (500 mg) by mouth 2 times daily as needed (GERD). 100 tablet 0   . gabapentin (NEURONTIN) 300 MG capsule Take 1 capsule (300 mg) by mouth nightly as needed (When feeling back pain). 90 capsule 0   . hydrochlorothiazide (HYDRODIURIL) 25 MG tablet Take 1 tablet (25 mg) by mouth daily. 90 tablet 0   . ibuprofen (MOTRIN) 400 MG tablet Take 1 tablet (400 mg) by mouth at bedtime. Take with food. 100 tablet 0   . levothyroxine (SYNTHROID) 100 MCG tablet Take 1 tablet (100 mcg) by mouth every morning (before breakfast). 90 tablet  0   . lisinopril (PRINIVIL, ZESTRIL) 40 MG tablet Take 1 tablet (40 mg) by mouth daily. 90 tablet 0   . metFORMIN (GLUCOPHAGE) 1000 mg tablet Take 1 tablet (1,000 mg) by mouth 2 times daily (with meals). 180 tablet 0   . spironolactone (ALDACTONE) 25 MG tablet Take 0.5 tablets (12.5 mg) by mouth daily. 45 tablet 0     No facility-administered medications prior to visit.       OBJECTIVE:  Physical Exam and VS deferred because telemedicine visit: General: Speaking in full, uninterrupted sentences with unlabored breathing. Appropriate tone and logical speech.    Relevant Labs: A1c 6.2 07/12/2020, TSH    Results for EARA, BURRUEL  [facility] (MRN [id number]) as of 07/19/2020 17:01   Ref. Range 04/01/2019 10:42   Cholesterol Latest Ref Range: <200 mg/dL 027   LDL-Cholesterol Latest Units: mg/dL (calc) 74   HDL Cholesterol Latest Ref Range: > OR = 50 mg/dL 35 (L)   Chol/HDLC Ratio Latest Ref Range: <5.0 (calc) 4.2   Non-HDL Cholesterol Latest Ref Range: <130 mg/dL (calc) 741     Assessment and Plan:    Felicia [name] is a 65 year old female seen for blood pressure follow up    #Mucocele  "Pimple" like, non-painful, non-ulcerative lesions that have been intermittently bothersome, mostly in the summer and improve with cold temperature. They are not consistent with infectious etiology given absence of inflammation, ulceration, pain, or systemic symptoms. There is low concern for malignancy given she does not have a smoking or heavy drinking history.  - Return to clinic in one month for in-person follow-up for further evaluation    #Hypertension  Blood pressures well controlled without hypotensive side effects on current regimen, continue to monitor home BP's.    #Hyperlipidemia  Well controlled, plan for repeat lipids at next visit as it will be approximately one month since last visit    #DMII  Last A1c 6.2, slight elevation from previous in the context of limited exercise  - Continue metformin, recheck A1c in 12/2020 (six months from prior)    PAVS Total Activity: 0 minutes/week minutes/week   I have counseled Felicia [name] on improving her level of exercise in the following way:     Advised patient to engage in physical activity as recommended in the patient education handout.         Health Care Maintenance (HCM)  Colon cancer last done august 2021  Mammogram/PAP if appropriate: Mammogram done 11/2019  Pneumovax/Tdap/Shingles vaccines as needed (done through pharmacy) - will let her new clinic through Medi-Cal know about this.  Diabetes-specific HCM as appropriate  Contraception if appropriate (done via referral to a community health center, message  social work if questions):    were spent with patient on the phone and additional time was spent coordinating labs, medications and follow up.    Next appointment: 08/16/2020    To do items at next visit:  -Check lipids (~1 year out from prior)  -Due for FOBT  -F/u dexa scan  -F/u shingles vaccine #2  -F/u blood pressure, if still well controlled consider removing spironolactone

## 2020-08-08 ENCOUNTER — Telehealth: Payer: Self-pay

## 2020-08-08 DIAGNOSIS — Z723 Lack of physical exercise: Secondary | ICD-10-CM

## 2020-08-08 NOTE — Progress Notes (Addendum)
Collaborative Care Encounter 08/08/2020   Length of Session 20   Session Number 1   Unit of Consultation Individual   Mode of Consultation Telephone EIM   Some recent data might be hidden     Brief primary care behavioral health [id number]) telephone call with patient regarding physical activity (PA) levels, identified by the Physical Activity Vital Sign (PAVS) screening tool conducted today following patient's recent PCP visit. Patient is currently in the contemplation stage of change for PA, as she is actively thinking about engaging in exercise but is not ready to commit to a goal or plan at this time. Reviewed the PA Manual with patient and used motivational interviewing strategies to identify benefits, barriers, goals, and self-monitoring for increasing current PA levels.     Pt had a routine prior to this call for about 8 months. Will restart in-home routine following YouTube channel "[employer]" and bodyweight exercise routines for 20 min each day. Wants to make routine a priority in order to lower inflammation in legs. Pt will track accountability via calendar.     Will f/u with pt on August 9 @ 6:00PM.    I have read this encounter note and agree with this health coach's activities and recommendations. Real Cons, MS, RN, [facility]

## 2020-08-16 ENCOUNTER — Encounter: Payer: Self-pay | Admitting: Family Medicine

## 2020-10-11 ENCOUNTER — Encounter: Payer: Self-pay | Admitting: Family Medicine

## 2020-12-02 ENCOUNTER — Ambulatory Visit (INDEPENDENT_AMBULATORY_CARE_PROVIDER_SITE_OTHER): Payer: Self-pay | Admitting: Ophthalmology

## 2021-10-03 ENCOUNTER — Encounter: Payer: Self-pay | Admitting: Ophthalmology

## 2021-10-03 ENCOUNTER — Ambulatory Visit: Payer: Self-pay | Admitting: Ophthalmology

## 2021-10-03 VITALS — BP 132/60 | HR 91 | Temp 97.7°F | Resp 16 | Wt 191.4 lb

## 2021-10-03 DIAGNOSIS — E119 Type 2 diabetes mellitus without complications: Secondary | ICD-10-CM

## 2021-10-03 NOTE — Progress Notes (Signed)
Patient has established care at [facility]. Patient will transition to seeking ophthalmologist at her clinic, optho will give her the paper she needs in order to seek care at her clinic.     Perley Jain, SW Intern

## 2021-10-03 NOTE — Progress Notes (Unsigned)
[facility] Note  Felicia [name]  [birth year]  [id number]  10/03/21      Reason for Referral:    SUBJECTIVE::Felicia [name] is a 66 year old female with h/o type II diabetes here for diabetic retinopathy screening. Patient has no vision changes or other complaints.     Patient's 2 most recent HgbA1C:  Lab Results   Component Value Date    A1C 6.2 (H) 07/12/2020    A1C 5.9 (H) 11/30/2019       Past Medical History  Past Medical History:   Diagnosis Date    Back pain at L4-L5 level 11/2014    Diabetes mellitus type 2, controlled, without complications (CMS-HCC)     GERD (gastroesophageal reflux disease)     Hyperlipidemia associated with type 2 diabetes mellitus (CMS-HCC)     Hypertension     Hypothyroidism     Obesity        Medications/Allergies:     Current Outpatient Medications   Medication Sig    amLODIPINE (NORVASC) 10 MG tablet Take 1 tablet (10 mg) by mouth daily.    aspirin 81 MG EC tablet Take 1 tablet (81 mg) by mouth daily.    atorvastatin (LIPITOR) 40 MG tablet Take 1 tablet (40 mg) by mouth daily.    calcium carbonate (TUMS) 500 MG tablet Take 1 tablet (500 mg) by mouth 2 times daily as needed (GERD).    gabapentin (NEURONTIN) 300 MG capsule Take 1 capsule (300 mg) by mouth nightly as needed (When feeling back pain).    hydrochlorothiazide (HYDRODIURIL) 25 MG tablet Take 1 tablet (25 mg) by mouth daily.    ibuprofen (MOTRIN) 400 MG tablet Take 1 tablet (400 mg) by mouth at bedtime. Take with food.    levothyroxine (SYNTHROID) 100 MCG tablet Take 1 tablet (100 mcg) by mouth every morning (before breakfast).    lisinopril (PRINIVIL, ZESTRIL) 40 MG tablet Take 1 tablet (40 mg) by mouth daily.    metFORMIN (GLUCOPHAGE) 1000 mg tablet Take 1 tablet (1,000 mg) by mouth 2 times daily (with meals).    spironolactone (ALDACTONE) 25 MG tablet Take 0.5 tablets (12.5 mg) by mouth daily.     No current facility-administered medications for this visit.       No Known  Allergies    Family History   Problem Relation Name Age of Onset    Cancer Other      Diabetes Other      Hypertension Mother           Exam  DOUBLE CHECK ALL UNDERLINED EXAM INFO    Vision          Distance: OD: 20/20   Cc  OS: 20/20    Pupillary Reflex: PERRL    EOM: EOMI  CVF: normal    IOP (tono/app)  OD 13  OS  12    Dilated Fundus Exam  Disc:  pink/sharp/flat OU, no neovascularization     C/D:  0.3   Macula : flat, no exudates/hemorrhage or edema OU   Vessels: within normal limits OU   Periphery: within normal limits OU, no retinal tear or detachment visualized            Assessment/Plan:    No diabetic retinopathy on exam  2.   2 year f/u for next screening since [facility] <6.5          Attending: Westley Chandler, MD  Note written by medical student, Felicia [name],  MS1
